# Patient Record
Sex: Male | Born: 1990 | Race: White | Hispanic: No | Marital: Single | Smoking: Never smoker
Health system: Southern US, Community
[De-identification: ages and names within clinical notes are randomized; demographics above are authoritative.]

## PROBLEM LIST (undated history)

## (undated) ENCOUNTER — Emergency Department (HOSPITAL_BASED_OUTPATIENT_CLINIC_OR_DEPARTMENT_OTHER): Discharge status: Absent without leave | Payer: BLUE CROSS/BLUE SHIELD

## (undated) DIAGNOSIS — E119 Type 2 diabetes mellitus without complications: Secondary | ICD-10-CM

---

## 2014-11-24 ENCOUNTER — Encounter (HOSPITAL_COMMUNITY): Payer: Self-pay | Admitting: *Deleted

## 2014-11-24 ENCOUNTER — Emergency Department (HOSPITAL_COMMUNITY): Payer: BLUE CROSS/BLUE SHIELD

## 2014-11-24 ENCOUNTER — Emergency Department (HOSPITAL_COMMUNITY)
Admission: EM | Admit: 2014-11-24 | Discharge: 2014-11-24 | Disposition: A | Discharge status: Home / Self Care | Payer: BLUE CROSS/BLUE SHIELD | Attending: Emergency Medicine | Admitting: Emergency Medicine

## 2014-11-24 DIAGNOSIS — Z794 Long term (current) use of insulin: Secondary | ICD-10-CM | POA: Diagnosis not present

## 2014-11-24 DIAGNOSIS — E119 Type 2 diabetes mellitus without complications: Secondary | ICD-10-CM | POA: Diagnosis not present

## 2014-11-24 DIAGNOSIS — S01312D Laceration without foreign body of left ear, subsequent encounter: Secondary | ICD-10-CM | POA: Diagnosis not present

## 2014-11-24 DIAGNOSIS — R220 Localized swelling, mass and lump, head: Secondary | ICD-10-CM

## 2014-11-24 DIAGNOSIS — L03211 Cellulitis of face: Secondary | ICD-10-CM | POA: Diagnosis not present

## 2014-11-24 DIAGNOSIS — R51 Headache: Secondary | ICD-10-CM | POA: Diagnosis present

## 2014-11-24 HISTORY — DX: Type 2 diabetes mellitus without complications: E11.9

## 2014-11-24 LAB — BASIC METABOLIC PANEL
Anion gap: 7 (ref 5–15)
CALCIUM: 8.8 mg/dL (ref 8.4–10.5)
CO2: 28 mmol/L (ref 19–32)
Chloride: 102 mmol/L (ref 96–112)
Creatinine, Ser: 0.55 mg/dL (ref 0.50–1.35)
GFR calc Af Amer: >90 mL/min (ref >90)
GFR calc non Af Amer: >90 mL/min (ref >90)
GLUCOSE: 151 mg/dL — AB (ref 70–99)
Potassium: 3.5 mmol/L (ref 3.5–5.1)
Sodium: 137 mmol/L (ref 135–145)

## 2014-11-24 LAB — CBC WITH DIFFERENTIAL/PLATELET
Basophils Absolute: 0 10*3/uL (ref 0.0–0.1)
Basophils Relative: 0 % (ref 0–1)
EOS ABS: 0.1 10*3/uL (ref 0.0–0.7)
EOS PCT: 2 % (ref 0–5)
HCT: 44 % (ref 39.0–52.0)
Hemoglobin: 15 g/dL (ref 13.0–17.0)
Lymphocytes Relative: 43 % (ref 12–46)
Lymphs Abs: 3 10*3/uL (ref 0.7–4.0)
MCH: 28.9 pg (ref 26.0–34.0)
MCHC: 34.1 g/dL (ref 30.0–36.0)
MCV: 84.8 fL (ref 78.0–100.0)
Monocytes Absolute: 0.5 10*3/uL (ref 0.1–1.0)
Monocytes Relative: 8 % (ref 3–12)
Neutro Abs: 3.2 10*3/uL (ref 1.7–7.7)
Neutrophils Relative %: 47 % (ref 43–77)
Platelets: 207 10*3/uL (ref 150–400)
RBC: 5.19 MIL/uL (ref 4.22–5.81)
RDW: 12.5 % (ref 11.5–15.5)
WBC: 6.9 10*3/uL (ref 4.0–10.5)

## 2014-11-24 LAB — CBG MONITORING, ED: Glucose-Capillary: 143 mg/dL — ABNORMAL HIGH (ref 70–99)

## 2014-11-24 LAB — I-STAT CG4 LACTIC ACID, ED: LACTIC ACID, VENOUS: 1.44 mmol/L (ref 0.5–2.0)

## 2014-11-24 MED ORDER — MORPHINE SULFATE 4 MG/ML IJ SOLN
4.0000 mg | Freq: Once | INTRAMUSCULAR | Status: AC
  Administered 2014-11-24: 4 mg via INTRAVENOUS
  Filled 2014-11-24: qty 1

## 2014-11-24 MED ORDER — CLINDAMYCIN HCL 150 MG PO CAPS: 300.0000 mg | ORAL_CAPSULE | Freq: Four times a day (QID) | ORAL | Status: AC

## 2014-11-24 MED ORDER — IOHEXOL 300 MG/ML  SOLN
75.0000 mL | Freq: Once | INTRAMUSCULAR | Status: AC | PRN
  Administered 2014-11-24: 75 mL via INTRAVENOUS

## 2014-11-24 MED ORDER — OXYCODONE-ACETAMINOPHEN 5-325 MG PO TABS: 2.0000 | ORAL_TABLET | ORAL | Status: DC | PRN

## 2014-11-24 MED ORDER — CLINDAMYCIN PHOSPHATE 600 MG/50ML IV SOLN
600.0000 mg | Freq: Once | INTRAVENOUS | Status: AC
  Administered 2014-11-24: 600 mg via INTRAVENOUS
  Filled 2014-11-24: qty 50

## 2014-11-24 MED ORDER — NAPROXEN 500 MG PO TABS: 500.0000 mg | ORAL_TABLET | Freq: Two times a day (BID) | ORAL | Status: DC

## 2014-11-24 MED ORDER — ONDANSETRON HCL 4 MG/2ML IJ SOLN
4.0000 mg | Freq: Once | INTRAMUSCULAR | Status: AC
  Administered 2014-11-24: 4 mg via INTRAVENOUS
  Filled 2014-11-24: qty 2

## 2014-11-24 NOTE — ED Notes (Signed)
Pt's CBG is 143.
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
## 2014-11-24 NOTE — Discharge Instructions (Signed)
Stop taking ciprofloxacin.  Begin taking clindamycin.  Take pain medicines as needed.  Follow-up with your primary care doctor in 2 days for recheck.  Return to the emergency department for worsening condition or new concerning symptoms.   Cellulitis Cellulitis is an infection of the skin and the tissue beneath it. The infected area is usually red and tender. Cellulitis occurs most often in the arms and lower legs.  CAUSES  Cellulitis is caused by bacteria that enter the skin through cracks or cuts in the skin. The most common types of bacteria that cause cellulitis are staphylococci and streptococci. SIGNS AND SYMPTOMS   Redness and warmth.  Swelling.  Tenderness or pain.  Fever. DIAGNOSIS  Your health care provider can usually determine what is wrong based on a physical exam. Blood tests may also be done. TREATMENT  Treatment usually involves taking an antibiotic medicine. HOME CARE INSTRUCTIONS   Take your antibiotic medicine as directed by your health care provider. Finish the antibiotic even if you start to feel better.  Keep the infected arm or leg elevated to reduce swelling.  Apply a warm cloth to the affected area up to 4 times per day to relieve pain.  Take medicines only as directed by your health care provider.  Keep all follow-up visits as directed by your health care provider. SEEK MEDICAL CARE IF:   You notice red streaks coming from the infected area.  Your red area gets larger or turns dark in color.  Your bone or joint underneath the infected area becomes painful after the skin has healed.  Your infection returns in the same area or another area.  You notice a swollen bump in the infected area.  You develop new symptoms.  You have a fever. SEEK IMMEDIATE MEDICAL CARE IF:   You feel very sleepy.  You develop vomiting or diarrhea.  You have a general ill feeling (malaise) with muscle aches and pains. MAKE SURE YOU:   Understand these  instructions.  Will watch your condition.  Will get help right away if you are not doing well or get worse. Document Released: 06/20/2005 Document Revised: 01/25/2014 Document Reviewed: 11/26/2011 [facility] Patient Information 2015 [place]. This information is not intended to replace advice given to you by your health care provider. Make sure you discuss any questions you have with your health care provider.  Facial Infection You have an infection of your face. This requires special attention to help prevent serious problems. Infections in facial wounds can cause poor healing and scars. They can also spread to deeper tissues, especially around the eye. Wound and dental infections can lead to sinusitis, infection of the eye socket, and even meningitis. Permanent damage to the skin, eye, and nervous system may result if facial infections are not treated properly. With severe infections, hospital care for IV antibiotic injections may be needed if they don't respond to oral antibiotics. Antibiotics must be taken for the full course to insure the infection is eliminated. If the infection came from a bad tooth, it may have to be extracted when the infection is under control. Warm compresses may be applied to reduce skin irritation and remove drainage. You might need a tetanus shot now if:  You cannot remember when your last tetanus shot was.  You have never had a tetanus shot.  The object that caused your wound was dirty. If you need a tetanus shot, and you decide not to get one, there is a rare chance of getting tetanus.  Sickness from tetanus can be serious. If you got a tetanus shot, your arm may swell, get red and warm to the touch at the shot site. This is common and not a problem. SEEK IMMEDIATE MEDICAL CARE IF:   You have increased swelling, redness, or trouble breathing.  You have a severe headache, dizziness, nausea, or vomiting.  You develop problems with your eyesight.  You have  a fever. Document Released: 10/18/2004 Document Revised: 12/03/2011 Document Reviewed: 09/10/2005 [facility] Patient Information 2015 [place], [place]. This information is not intended to replace advice given to you by your health care provider. Make sure you discuss any questions you have with your health care provider.
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
## 2016-07-01 ENCOUNTER — Emergency Department (HOSPITAL_COMMUNITY): Payer: BLUE CROSS/BLUE SHIELD

## 2016-07-01 ENCOUNTER — Encounter (HOSPITAL_COMMUNITY): Payer: Self-pay | Admitting: *Deleted

## 2016-07-01 ENCOUNTER — Inpatient Hospital Stay (HOSPITAL_COMMUNITY)
Admission: EM | Admit: 2016-07-01 | Discharge: 2016-07-04 | DRG: 481 | Disposition: A | Discharge status: Home / Self Care | Payer: BLUE CROSS/BLUE SHIELD | Attending: General Surgery | Admitting: General Surgery

## 2016-07-01 ENCOUNTER — Encounter (HOSPITAL_COMMUNITY): Admission: EM | Disposition: A | Discharge status: Home / Self Care | Payer: Self-pay | Source: Home / Self Care

## 2016-07-01 DIAGNOSIS — S72302C Unspecified fracture of shaft of left femur, initial encounter for open fracture type IIIA, IIIB, or IIIC: Secondary | ICD-10-CM | POA: Diagnosis present

## 2016-07-01 DIAGNOSIS — Z794 Long term (current) use of insulin: Secondary | ICD-10-CM | POA: Diagnosis not present

## 2016-07-01 DIAGNOSIS — Z419 Encounter for procedure for purposes other than remedying health state, unspecified: Secondary | ICD-10-CM

## 2016-07-01 DIAGNOSIS — S72352C Displaced comminuted fracture of shaft of left femur, initial encounter for open fracture type IIIA, IIIB, or IIIC: Secondary | ICD-10-CM | POA: Diagnosis not present

## 2016-07-01 DIAGNOSIS — M79652 Pain in left thigh: Secondary | ICD-10-CM | POA: Diagnosis present

## 2016-07-01 DIAGNOSIS — G478 Other sleep disorders: Secondary | ICD-10-CM

## 2016-07-01 DIAGNOSIS — S71132A Puncture wound without foreign body, left thigh, initial encounter: Secondary | ICD-10-CM

## 2016-07-01 DIAGNOSIS — S7292XB Unspecified fracture of left femur, initial encounter for open fracture type I or II: Secondary | ICD-10-CM | POA: Diagnosis present

## 2016-07-01 DIAGNOSIS — Z720 Tobacco use: Secondary | ICD-10-CM | POA: Diagnosis not present

## 2016-07-01 DIAGNOSIS — D62 Acute posthemorrhagic anemia: Secondary | ICD-10-CM | POA: Diagnosis not present

## 2016-07-01 DIAGNOSIS — E119 Type 2 diabetes mellitus without complications: Secondary | ICD-10-CM

## 2016-07-01 DIAGNOSIS — W3400XA Accidental discharge from unspecified firearms or gun, initial encounter: Secondary | ICD-10-CM

## 2016-07-01 DIAGNOSIS — W320XXA Accidental handgun discharge, initial encounter: Secondary | ICD-10-CM | POA: Diagnosis not present

## 2016-07-01 DIAGNOSIS — E109 Type 1 diabetes mellitus without complications: Secondary | ICD-10-CM | POA: Diagnosis present

## 2016-07-01 HISTORY — PX: INTRAMEDULLARY (IM) NAIL INTERTROCHANTERIC: SHX5875

## 2016-07-01 LAB — BASIC METABOLIC PANEL
ANION GAP: 15 (ref 5–15)
BUN: 9 mg/dL (ref 6–20)
CALCIUM: 9.3 mg/dL (ref 8.9–10.3)
CO2: 22 mmol/L (ref 22–32)
Chloride: 98 mmol/L — ABNORMAL LOW (ref 101–111)
Creatinine, Ser: 1.06 mg/dL (ref 0.61–1.24)
GFR calc Af Amer: >60 mL/min (ref >60)
GFR calc non Af Amer: >60 mL/min (ref >60)
GLUCOSE: 442 mg/dL — AB (ref 65–99)
POTASSIUM: 3.4 mmol/L — AB (ref 3.5–5.1)
Sodium: 135 mmol/L (ref 135–145)

## 2016-07-01 LAB — I-STAT CHEM 8, ED
BUN: 10 mg/dL (ref 6–20)
CHLORIDE: 98 mmol/L — AB (ref 101–111)
CREATININE: 1.1 mg/dL (ref 0.61–1.24)
Calcium, Ion: 1.1 mmol/L — ABNORMAL LOW (ref 1.15–1.40)
Glucose, Bld: 432 mg/dL — ABNORMAL HIGH (ref 65–99)
HEMATOCRIT: 47 % (ref 39.0–52.0)
Hemoglobin: 16 g/dL (ref 13.0–17.0)
POTASSIUM: 3.4 mmol/L — AB (ref 3.5–5.1)
Sodium: 139 mmol/L (ref 135–145)
TCO2: 23 mmol/L (ref 0–100)

## 2016-07-01 LAB — I-STAT VENOUS BLOOD GAS, ED
ACID-BASE DEFICIT: 2 mmol/L (ref 0.0–2.0)
BICARBONATE: 23.2 mmol/L (ref 20.0–28.0)
O2 SAT: 91 %
TCO2: 24 mmol/L (ref 0–100)
pCO2, Ven: 39.9 mmHg — ABNORMAL LOW (ref 44.0–60.0)
pH, Ven: 7.372 (ref 7.250–7.430)
pO2, Ven: 63 mmHg — ABNORMAL HIGH (ref 32.0–45.0)

## 2016-07-01 LAB — CBC
HEMATOCRIT: 45.2 % (ref 39.0–52.0)
HEMOGLOBIN: 15.9 g/dL (ref 13.0–17.0)
MCH: 29.6 pg (ref 26.0–34.0)
MCHC: 35.2 g/dL (ref 30.0–36.0)
MCV: 84 fL (ref 78.0–100.0)
Platelets: 250 10*3/uL (ref 150–400)
RBC: 5.38 MIL/uL (ref 4.22–5.81)
RDW: 12.3 % (ref 11.5–15.5)
WBC: 7.9 10*3/uL (ref 4.0–10.5)

## 2016-07-01 LAB — APTT: aPTT: 26 seconds (ref 24–36)

## 2016-07-01 LAB — PROTIME-INR
INR: 0.99
Prothrombin Time: 13.1 seconds (ref 11.4–15.2)

## 2016-07-01 LAB — CBG MONITORING, ED: GLUCOSE-CAPILLARY: 325 mg/dL — AB (ref 65–99)

## 2016-07-01 LAB — I-STAT CG4 LACTIC ACID, ED: Lactic Acid, Venous: 4.11 mmol/L (ref 0.5–1.9)

## 2016-07-01 LAB — I-STAT TROPONIN, ED: Troponin i, poc: 0 ng/mL (ref 0.00–0.08)

## 2016-07-01 SURGERY — FIXATION, FRACTURE, INTERTROCHANTERIC, WITH INTRAMEDULLARY ROD
Anesthesia: General | Site: Leg Upper | Laterality: Left

## 2016-07-01 MED ORDER — CEFAZOLIN SODIUM-DEXTROSE 2-4 GM/100ML-% IV SOLN
2.0000 g | Freq: Once | INTRAVENOUS | Status: AC
  Administered 2016-07-01: 2 g via INTRAVENOUS
  Filled 2016-07-01: qty 100

## 2016-07-01 MED ORDER — POTASSIUM CHLORIDE IN NACL 20-0.9 MEQ/L-% IV SOLN
INTRAVENOUS | Status: DC
  Administered 2016-07-02 – 2016-07-03 (×4): via INTRAVENOUS
  Filled 2016-07-01 (×4): qty 1000

## 2016-07-01 MED ORDER — INSULIN ASPART 100 UNIT/ML ~~LOC~~ SOLN
15.0000 [IU] | Freq: Once | SUBCUTANEOUS | Status: AC
  Administered 2016-07-01: 15 [IU] via INTRAVENOUS

## 2016-07-01 MED ORDER — HYDROMORPHONE HCL 1 MG/ML IJ SOLN
1.0000 mg | INTRAMUSCULAR | Status: DC | PRN
  Filled 2016-07-01: qty 1

## 2016-07-01 MED ORDER — FENTANYL CITRATE (PF) 100 MCG/2ML IJ SOLN
INTRAMUSCULAR | Status: AC
  Filled 2016-07-01: qty 4

## 2016-07-01 MED ORDER — SODIUM CHLORIDE 0.9 % IV SOLN
Freq: Once | INTRAVENOUS | Status: AC
  Administered 2016-07-01: via INTRAVENOUS

## 2016-07-01 MED ORDER — ONDANSETRON HCL 4 MG PO TABS: 4.0000 mg | ORAL_TABLET | Freq: Four times a day (QID) | ORAL | Status: DC | PRN

## 2016-07-01 MED ORDER — HYDROMORPHONE HCL 1 MG/ML IJ SOLN
1.0000 mg | Freq: Once | INTRAMUSCULAR | Status: AC
Start: 2016-07-01 — End: 2016-07-01
  Administered 2016-07-01: 1 mg via INTRAVENOUS

## 2016-07-01 MED ORDER — ENOXAPARIN SODIUM 40 MG/0.4ML ~~LOC~~ SOLN
40.0000 mg | SUBCUTANEOUS | Status: DC
  Administered 2016-07-02 – 2016-07-03 (×2): 40 mg via SUBCUTANEOUS
  Filled 2016-07-01 (×2): qty 0.4

## 2016-07-01 MED ORDER — INSULIN ASPART 100 UNIT/ML ~~LOC~~ SOLN
0.0000 [IU] | Freq: Three times a day (TID) | SUBCUTANEOUS | Status: DC
Start: 2016-07-02 — End: 2016-07-02
  Administered 2016-07-02: 7 [IU] via SUBCUTANEOUS
  Administered 2016-07-02: 4 [IU] via SUBCUTANEOUS
  Filled 2016-07-01: qty 1

## 2016-07-01 MED ORDER — TETANUS-DIPHTH-ACELL PERTUSSIS 5-2.5-18.5 LF-MCG/0.5 IM SUSP
0.5000 mL | Freq: Once | INTRAMUSCULAR | Status: AC
  Administered 2016-07-01: 0.5 mL via INTRAMUSCULAR
  Filled 2016-07-01: qty 0.5

## 2016-07-01 MED ORDER — CEFAZOLIN IN D5W 1 GM/50ML IV SOLN
1.0000 g | Freq: Three times a day (TID) | INTRAVENOUS | Status: DC
  Filled 2016-07-01 (×2): qty 50

## 2016-07-01 MED ORDER — MIDAZOLAM HCL 2 MG/2ML IJ SOLN
INTRAMUSCULAR | Status: AC
  Filled 2016-07-01: qty 2

## 2016-07-01 MED ORDER — INSULIN DETEMIR 100 UNIT/ML ~~LOC~~ SOLN
10.0000 [IU] | Freq: Every day | SUBCUTANEOUS | Status: DC
  Filled 2016-07-01 (×2): qty 0.1

## 2016-07-01 MED ORDER — ONDANSETRON HCL 4 MG/2ML IJ SOLN: 4.0000 mg | Freq: Four times a day (QID) | INTRAMUSCULAR | Status: DC | PRN

## 2016-07-01 MED ORDER — SODIUM CHLORIDE 0.9 % IV SOLN
Freq: Once | INTRAVENOUS | Status: AC
  Administered 2016-07-01: 23:00:00 via INTRAVENOUS

## 2016-07-01 SURGICAL SUPPLY — 43 items
BIT DRILL LONG 4.0 (BIT) ×1 IMPLANT
BIT DRILL SHORT 4.0 (BIT) ×1 IMPLANT
BLADE SURG 15 STRL LF DISP TIS (BLADE) ×1 IMPLANT
BLADE SURG 15 STRL SS (BLADE) ×2
COVER PERINEAL POST (MISCELLANEOUS) ×3 IMPLANT
COVER SURGICAL LIGHT HANDLE (MISCELLANEOUS) ×6 IMPLANT
COVER TABLE BACK 60X90 (DRAPES) ×3 IMPLANT
DRAPE C-ARM 42X72 X-RAY (DRAPES) ×3 IMPLANT
DRAPE STERI IOBAN 125X83 (DRAPES) ×3 IMPLANT
DRILL BIT LONG 4.0 (BIT) ×3
DRILL BIT SHORT 4.0 (BIT) ×2
DRSG ADAPTIC 3X8 NADH LF (GAUZE/BANDAGES/DRESSINGS) IMPLANT
DRSG MEPILEX BORDER 4X4 (GAUZE/BANDAGES/DRESSINGS) ×3 IMPLANT
DRSG MEPILEX BORDER 4X8 (GAUZE/BANDAGES/DRESSINGS) ×3 IMPLANT
DRSG PAD ABDOMINAL 8X10 ST (GAUZE/BANDAGES/DRESSINGS) ×6 IMPLANT
ELECT REM PT RETURN 9FT ADLT (ELECTROSURGICAL) ×3
ELECTRODE REM PT RTRN 9FT ADLT (ELECTROSURGICAL) ×1 IMPLANT
EVACUATOR 1/8 PVC DRAIN (DRAIN) IMPLANT
GLOVE BIOGEL PI IND STRL 9 (GLOVE) ×1 IMPLANT
GLOVE BIOGEL PI INDICATOR 9 (GLOVE) ×2
GLOVE SURG ORTHO 9.0 STRL STRW (GLOVE) ×3 IMPLANT
GOWN STRL REUS W/ TWL XL LVL3 (GOWN DISPOSABLE) ×3 IMPLANT
GOWN STRL REUS W/TWL XL LVL3 (GOWN DISPOSABLE) ×6
GUIDE PIN 3.2X343 (PIN) ×1
GUIDE PIN 3.2X343MM (PIN) ×2
GUIDE ROD 3.0 (MISCELLANEOUS) ×3
KIT BASIN OR (CUSTOM PROCEDURE TRAY) ×3 IMPLANT
KIT ROOM TURNOVER OR (KITS) ×3 IMPLANT
LINER BOOT UNIVERSAL DISP (MISCELLANEOUS) ×3 IMPLANT
MANIFOLD NEPTUNE II (INSTRUMENTS) ×3 IMPLANT
NAIL TRIGEN TROCH META 9.0X40 (Nail) ×3 IMPLANT
NS IRRIG 1000ML POUR BTL (IV SOLUTION) ×3 IMPLANT
PACK GENERAL/GYN (CUSTOM PROCEDURE TRAY) ×3 IMPLANT
PAD ARMBOARD 7.5X6 YLW CONV (MISCELLANEOUS) ×6 IMPLANT
PIN GUIDE 3.2X343MM (PIN) ×1 IMPLANT
ROD GUIDE 3.0 (MISCELLANEOUS) ×1 IMPLANT
SCREW TRIGEN LOW PROF 5.0X45 (Screw) ×3 IMPLANT
SCREW TRIGEN LOW PROF 5.0X50 (Screw) ×3 IMPLANT
SCREW TRIGEN LOW PROF 5.0X60 (Screw) ×3 IMPLANT
STAPLER VISISTAT 35W (STAPLE) IMPLANT
SUT ETHILON 2 0 PSLX (SUTURE) ×3 IMPLANT
SUT VIC AB 2-0 CTB1 (SUTURE) IMPLANT
WATER STERILE IRR 1000ML POUR (IV SOLUTION) ×6 IMPLANT

## 2016-07-01 NOTE — Anesthesia Preprocedure Evaluation (Signed)
Anesthesia Evaluation  Patient identified by MRN, date of birth, ID band Patient awake    Reviewed: Allergy & Precautions, NPO status , Patient's Chart, lab work & pertinent test results  Airway Mallampati: I  TM Distance: >3 FB Neck ROM: Full    Dental   Pulmonary    Pulmonary exam normal        Cardiovascular Normal cardiovascular exam     Neuro/Psych    GI/Hepatic   Endo/Other  diabetes, Poorly Controlled, Type 1, Insulin Dependent  Renal/GU      Musculoskeletal   Abdominal   Peds  Hematology   Anesthesia Other Findings   Reproductive/Obstetrics                             Anesthesia Physical Anesthesia Plan  ASA: II and emergent  Anesthesia Plan: General   Post-op Pain Management:    Induction: Intravenous, Rapid sequence and Cricoid pressure planned  Airway Management Planned: Oral ETT  Additional Equipment:   Intra-op Plan:   Post-operative Plan: Extubation in OR  Informed Consent: I have reviewed the patients History and Physical, chart, labs and discussed the procedure including the risks, benefits and alternatives for the proposed anesthesia with the patient or authorized representative who has indicated his/her understanding and acceptance.     Plan Discussed with: CRNA and Surgeon  Anesthesia Plan Comments:         Anesthesia Quick Evaluation
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
## 2016-07-02 ENCOUNTER — Inpatient Hospital Stay (HOSPITAL_COMMUNITY): Payer: BLUE CROSS/BLUE SHIELD | Admitting: Anesthesiology

## 2016-07-02 LAB — TYPE AND SCREEN
ABO/RH(D): O POS
ANTIBODY SCREEN: NEGATIVE
UNIT DIVISION: 0
UNIT DIVISION: 0

## 2016-07-02 LAB — PREPARE FRESH FROZEN PLASMA
Unit division: 0
Unit division: 0

## 2016-07-02 LAB — GLUCOSE, CAPILLARY
GLUCOSE-CAPILLARY: 223 mg/dL — AB (ref 65–99)
GLUCOSE-CAPILLARY: 174 mg/dL — AB (ref 65–99)
Glucose-Capillary: 217 mg/dL — ABNORMAL HIGH (ref 65–99)
Glucose-Capillary: 192 mg/dL — ABNORMAL HIGH (ref 65–99)
Glucose-Capillary: 158 mg/dL — ABNORMAL HIGH (ref 65–99)

## 2016-07-02 LAB — MRSA PCR SCREENING: MRSA by PCR: NEGATIVE

## 2016-07-02 LAB — BASIC METABOLIC PANEL
Anion gap: 9 (ref 5–15)
BUN: 7 mg/dL (ref 6–20)
CALCIUM: 7.7 mg/dL — AB (ref 8.9–10.3)
CO2: 23 mmol/L (ref 22–32)
Chloride: 106 mmol/L (ref 101–111)
Creatinine, Ser: 0.63 mg/dL (ref 0.61–1.24)
GFR calc Af Amer: >60 mL/min (ref >60)
GLUCOSE: 264 mg/dL — AB (ref 65–99)
Potassium: 4.3 mmol/L (ref 3.5–5.1)
Sodium: 138 mmol/L (ref 135–145)

## 2016-07-02 LAB — CBC
HCT: 35.1 % — ABNORMAL LOW (ref 39.0–52.0)
Hemoglobin: 11.9 g/dL — ABNORMAL LOW (ref 13.0–17.0)
MCH: 29 pg (ref 26.0–34.0)
MCHC: 33.9 g/dL (ref 30.0–36.0)
MCV: 85.6 fL (ref 78.0–100.0)
Platelets: 209 10*3/uL (ref 150–400)
RBC: 4.1 MIL/uL — ABNORMAL LOW (ref 4.22–5.81)
RDW: 12.5 % (ref 11.5–15.5)
WBC: 10 10*3/uL (ref 4.0–10.5)

## 2016-07-02 LAB — ABO/RH: ABO/RH(D): O POS

## 2016-07-02 LAB — BLOOD PRODUCT ORDER (VERBAL) VERIFICATION

## 2016-07-02 MED ORDER — ONDANSETRON HCL 4 MG PO TABS: 4.0000 mg | ORAL_TABLET | Freq: Four times a day (QID) | ORAL | Status: DC | PRN

## 2016-07-02 MED ORDER — PHENYLEPHRINE HCL 10 MG/ML IJ SOLN
INTRAVENOUS | Status: DC | PRN
  Administered 2016-07-02: 10 ug/min via INTRAVENOUS

## 2016-07-02 MED ORDER — METOCLOPRAMIDE HCL 5 MG PO TABS: 5.0000 mg | ORAL_TABLET | Freq: Three times a day (TID) | ORAL | Status: DC | PRN

## 2016-07-02 MED ORDER — ONDANSETRON HCL 4 MG/2ML IJ SOLN: 4.0000 mg | Freq: Once | INTRAMUSCULAR | Status: DC | PRN

## 2016-07-02 MED ORDER — OXYCODONE HCL 5 MG PO TABS
5.0000 mg | ORAL_TABLET | ORAL | Status: DC | PRN
  Administered 2016-07-02 (×2): 10 mg via ORAL
  Administered 2016-07-03: 5 mg via ORAL
  Filled 2016-07-02 (×3): qty 2

## 2016-07-02 MED ORDER — HYDROMORPHONE HCL 1 MG/ML IJ SOLN
0.2500 mg | INTRAMUSCULAR | Status: DC | PRN
  Administered 2016-07-02 (×4): 0.5 mg via INTRAVENOUS

## 2016-07-02 MED ORDER — HYDROMORPHONE HCL 1 MG/ML IJ SOLN
INTRAMUSCULAR | Status: AC
  Filled 2016-07-02: qty 1

## 2016-07-02 MED ORDER — FENTANYL CITRATE (PF) 250 MCG/5ML IJ SOLN
INTRAMUSCULAR | Status: DC | PRN
  Administered 2016-07-02 (×2): 50 ug via INTRAVENOUS
  Administered 2016-07-02: 100 ug via INTRAVENOUS

## 2016-07-02 MED ORDER — MIDAZOLAM HCL 5 MG/5ML IJ SOLN
INTRAMUSCULAR | Status: DC | PRN
  Administered 2016-07-02 (×2): 1 mg via INTRAVENOUS

## 2016-07-02 MED ORDER — INSULIN ASPART 100 UNIT/ML ~~LOC~~ SOLN: 0.0000 [IU] | Freq: Every day | SUBCUTANEOUS | Status: DC

## 2016-07-02 MED ORDER — ACETAMINOPHEN 325 MG PO TABS
650.0000 mg | ORAL_TABLET | Freq: Four times a day (QID) | ORAL | Status: DC | PRN
  Administered 2016-07-02 – 2016-07-03 (×2): 650 mg via ORAL
  Filled 2016-07-02 (×2): qty 2

## 2016-07-02 MED ORDER — INSULIN ASPART 100 UNIT/ML ~~LOC~~ SOLN
0.0000 [IU] | Freq: Three times a day (TID) | SUBCUTANEOUS | Status: DC
  Administered 2016-07-02 – 2016-07-04 (×4): 3 [IU] via SUBCUTANEOUS

## 2016-07-02 MED ORDER — EPHEDRINE SULFATE 50 MG/ML IJ SOLN
INTRAMUSCULAR | Status: DC | PRN
  Administered 2016-07-02 (×2): 5 mg via INTRAVENOUS

## 2016-07-02 MED ORDER — PROPOFOL 10 MG/ML IV BOLUS
INTRAVENOUS | Status: DC | PRN
  Administered 2016-07-02: 140 mg via INTRAVENOUS

## 2016-07-02 MED ORDER — CEFAZOLIN IN D5W 1 GM/50ML IV SOLN
1.0000 g | Freq: Four times a day (QID) | INTRAVENOUS | Status: AC
  Administered 2016-07-02 (×3): 1 g via INTRAVENOUS
  Filled 2016-07-02 (×4): qty 50

## 2016-07-02 MED ORDER — SODIUM CHLORIDE 0.9 % IV SOLN
INTRAVENOUS | Status: DC | PRN
  Administered 2016-07-02 (×2): via INTRAVENOUS

## 2016-07-02 MED ORDER — ACETAMINOPHEN 650 MG RE SUPP: 650.0000 mg | Freq: Four times a day (QID) | RECTAL | Status: DC | PRN

## 2016-07-02 MED ORDER — SUCCINYLCHOLINE CHLORIDE 20 MG/ML IJ SOLN
INTRAMUSCULAR | Status: DC | PRN
  Administered 2016-07-02: 100 mg via INTRAVENOUS

## 2016-07-02 MED ORDER — LIDOCAINE HCL (CARDIAC) 20 MG/ML IV SOLN
INTRAVENOUS | Status: DC | PRN
  Administered 2016-07-02: 100 mg via INTRATRACHEAL

## 2016-07-02 MED ORDER — METHOCARBAMOL 1000 MG/10ML IJ SOLN: 500.0000 mg | Freq: Four times a day (QID) | INTRAVENOUS | Status: DC | PRN

## 2016-07-02 MED ORDER — HYDROMORPHONE HCL 1 MG/ML IJ SOLN
1.0000 mg | INTRAMUSCULAR | Status: DC | PRN
Start: 2016-07-02 — End: 2016-07-03
  Administered 2016-07-02 – 2016-07-03 (×6): 1 mg via INTRAVENOUS
  Filled 2016-07-02 (×6): qty 1

## 2016-07-02 MED ORDER — 0.9 % SODIUM CHLORIDE (POUR BTL) OPTIME
TOPICAL | Status: DC | PRN
  Administered 2016-07-02: 1000 mL

## 2016-07-02 MED ORDER — ONDANSETRON HCL 4 MG/2ML IJ SOLN: 4.0000 mg | Freq: Four times a day (QID) | INTRAMUSCULAR | Status: DC | PRN

## 2016-07-02 MED ORDER — METOCLOPRAMIDE HCL 5 MG/ML IJ SOLN: 5.0000 mg | Freq: Three times a day (TID) | INTRAMUSCULAR | Status: DC | PRN

## 2016-07-02 MED ORDER — METHOCARBAMOL 500 MG PO TABS
500.0000 mg | ORAL_TABLET | Freq: Four times a day (QID) | ORAL | Status: DC | PRN
  Administered 2016-07-02 (×3): 500 mg via ORAL
  Filled 2016-07-02 (×3): qty 1

## 2016-07-02 MED ORDER — CEFAZOLIN IN D5W 1 GM/50ML IV SOLN
INTRAVENOUS | Status: DC | PRN
  Administered 2016-07-02: 1 g via INTRAVENOUS

## 2016-07-02 MED ORDER — ALBUMIN HUMAN 5 % IV SOLN
INTRAVENOUS | Status: DC | PRN
  Administered 2016-07-02: 01:00:00 via INTRAVENOUS

## 2016-07-02 MED ORDER — INSULIN ASPART 100 UNIT/ML ~~LOC~~ SOLN
4.0000 [IU] | Freq: Three times a day (TID) | SUBCUTANEOUS | Status: DC
  Administered 2016-07-02 – 2016-07-04 (×2): 4 [IU] via SUBCUTANEOUS

## 2016-07-02 MED ORDER — MEPERIDINE HCL 25 MG/ML IJ SOLN: 6.2500 mg | INTRAMUSCULAR | Status: DC | PRN

## 2016-07-02 MED ORDER — CEFAZOLIN IN D5W 1 GM/50ML IV SOLN
INTRAVENOUS | Status: AC
  Administered 2016-07-02: 1 g via INTRAVENOUS
  Filled 2016-07-02: qty 50

## 2016-07-02 MED ORDER — METHOCARBAMOL 500 MG PO TABS
ORAL_TABLET | ORAL | Status: AC
  Filled 2016-07-02: qty 1

## 2016-07-02 MED ORDER — ONDANSETRON HCL 4 MG/2ML IJ SOLN
INTRAMUSCULAR | Status: DC | PRN
  Administered 2016-07-02: 4 mg via INTRAVENOUS

## 2016-07-02 MED ORDER — INSULIN DETEMIR 100 UNIT/ML ~~LOC~~ SOLN
10.0000 [IU] | Freq: Two times a day (BID) | SUBCUTANEOUS | Status: DC
  Administered 2016-07-02 – 2016-07-04 (×4): 10 [IU] via SUBCUTANEOUS
  Filled 2016-07-02 (×6): qty 0.1

## 2016-07-02 MED ORDER — HYDROMORPHONE HCL 1 MG/ML IJ SOLN
INTRAMUSCULAR | Status: AC
  Administered 2016-07-02: 1 mg
  Filled 2016-07-02: qty 1

## 2016-07-02 MED ORDER — CEFAZOLIN SODIUM 1 G IJ SOLR
INTRAMUSCULAR | Status: AC
  Filled 2016-07-02: qty 10

## 2016-07-02 MED ORDER — PHENYLEPHRINE HCL 10 MG/ML IJ SOLN
INTRAMUSCULAR | Status: DC | PRN
  Administered 2016-07-02 (×2): 80 ug via INTRAVENOUS

## 2016-07-02 NOTE — Progress Notes (Signed)
PT attempted to mobilize patient but due to bleeding from surgical site they were unable to continue. RN reinforced dressing. Patient put back to bed.
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
## 2016-07-02 NOTE — Transfer of Care (Signed)
Immediate Anesthesia Transfer of Care Note  Patient: Jesse Griffin  Procedure(s) Performed: Procedure(s): INTRAMEDULLARY (IM) NAIL INTERTROCHANTRIC (Left)  Patient Location: PACU  Anesthesia Type:General  Level of Consciousness: sedated  Airway & Oxygen Therapy: Patient Spontanous Breathing and Patient connected to face mask oxygen  Post-op Assessment: Report given to RN and Post -op Vital signs reviewed and stable  Post vital signs: Reviewed and stable  Last Vitals:  Vitals:   07/01/16 2315 07/01/16 2330  BP: 136/99 144/96  Pulse: 118 (!) 121  Resp: 14 13  Temp:      Last Pain:  Vitals:   07/01/16 2336  TempSrc:   PainSc: 5          Complications: No apparent anesthesia complications

## 2016-07-02 NOTE — Anesthesia Postprocedure Evaluation (Signed)
Anesthesia Post Note  Patient: Teacher, [name]  Procedure(s) Performed: Procedure(s) (LRB): INTRAMEDULLARY (IM) NAIL INTERTROCHANTRIC (Left)  Patient location during evaluation: PACU Anesthesia Type: General Level of consciousness: awake and alert Pain management: pain level controlled Vital Signs Assessment: post-procedure vital signs reviewed and stable Respiratory status: spontaneous breathing, nonlabored ventilation, respiratory function stable and patient connected to nasal cannula oxygen Cardiovascular status: blood pressure returned to baseline and stable Postop Assessment: no signs of nausea or vomiting Anesthetic complications: no    Last Vitals:  Vitals:   07/02/16 1200 07/02/16 1300  BP: 121/85   Pulse: (!) 111 (!) 112  Resp: 14 15  Temp: 37.1 C     Last Pain:  Vitals:   07/02/16 1251  TempSrc:   PainSc: 1                  [name] DAVID
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
## 2016-07-02 NOTE — Progress Notes (Addendum)
Inpatient Diabetes Program Recommendations  AACE/ADA: New Consensus Statement on Inpatient Glycemic Control (2015)  Target Ranges:  Prepandial:   less than 140 mg/dL      Peak postprandial:   less than 180 mg/dL (1-2 hours)      Critically ill patients:  140 - 180 mg/dL   Lab Results  Component Value Date   GLUCAP 192 (H) 07/02/2016    Review of Glycemic Control:  Results for Jesse Griffin, Jesse (MRN [id number]) as of 07/02/2016 13:16  Ref. Range 07/01/2016 22:52 07/01/2016 23:04 07/02/2016 08:23  Glucose Latest Ref Range: 65 - 99 mg/dL 829442 (H) 562432 (H) 130264 (H)   Diabetes history: Type 1 diabetes since age 25 y.o. Outpatient Diabetes medications: Regular insulin 15-20 units tid- He also has NPH but admits that he doesn't take it Current orders for Inpatient glycemic control:  Levemir 10 units daily, Novolog resistant tid with meals  Inpatient Diabetes Program Recommendations:    Spoke with patient and mother at length regarding diabetes history and home medication regimen.  Patient's mother states that [facility]  He admits that he does this due to not liking the last MD that he saw and not having refills available on Lantus/Novolog. He is currently able to get insulin through his [employer] insurance however he will be turning 25 in November and plans to get his own policy.  He does not count CHO or follow any specific regimen noting that he takes his insulin frequently based on how he feels. Discussed Lantus/Novolog regimen.  Also reinforced need for follow-up with endocrinologist and the importance of glycemic control.  Called and discussed with PA.  Orders received for Levemir 10 units bid, Novolog 4 units with meals and moderate correction.   Will follow-up on 10/10.    Thanks, Beryl MeagerJenny Vashaun Osmon, RN, BC-ADM Inpatient Diabetes Coordinator Pager [phone] (8a-5p)

## 2016-07-02 NOTE — Anesthesia Procedure Notes (Signed)
Procedure Name: Intubation Date/Time: 07/02/2016 12:45 AM Performed by: Brien MatesMAHONY, Meaghann Choo D Pre-anesthesia Checklist: Patient identified, Emergency Drugs available, Suction available, Patient being monitored and Timeout performed Patient Re-evaluated:Patient Re-evaluated prior to inductionOxygen Delivery Method: Circle system utilized Intubation Type: IV induction and Cricoid Pressure applied Ventilation: Mask ventilation without difficulty Laryngoscope Size: Miller and 2 Grade View: Grade I Tube type: Oral Tube size: 7.5 mm Number of attempts: 1 Airway Equipment and Method: Stylet Placement Confirmation: ETT inserted through vocal cords under direct vision,  positive ETCO2 and breath sounds checked- equal and bilateral Secured at: 22 cm Tube secured with: Tape Dental Injury: Teeth and Oropharynx as per pre-operative assessment

## 2016-07-02 NOTE — Anesthesia Preprocedure Evaluation (Addendum)
Anesthesia Evaluation  Patient identified by MRN, date of birth, ID band Patient awake    Reviewed: Allergy & Precautions, NPO status , Patient's Chart, lab work & pertinent test results  Airway Mallampati: I  TM Distance: <3 FB Neck ROM: Full    Dental  (+) Teeth Intact, Dental Advisory Given   Pulmonary    Pulmonary exam normal        Cardiovascular Normal cardiovascular exam     Neuro/Psych    GI/Hepatic   Endo/Other  diabetes, Poorly Controlled, Type 1, Insulin Dependent  Renal/GU      Musculoskeletal   Abdominal   Peds  Hematology   Anesthesia Other Findings   Reproductive/Obstetrics                            Anesthesia Physical Anesthesia Plan  ASA: III and emergent  Anesthesia Plan: General   Post-op Pain Management:    Induction: Intravenous, Rapid sequence and Cricoid pressure planned  Airway Management Planned: Oral ETT  Additional Equipment:   Intra-op Plan:   Post-operative Plan: Extubation in OR  Informed Consent: I have reviewed the patients History and Physical, chart, labs and discussed the procedure including the risks, benefits and alternatives for the proposed anesthesia with the patient or authorized representative who has indicated his/her understanding and acceptance.   Dental advisory given  Plan Discussed with: CRNA and Surgeon  Anesthesia Plan Comments:        Anesthesia Quick Evaluation

## 2016-07-02 NOTE — Progress Notes (Signed)
Patient ID: Jesse BurnsDylan Griffin, male   DOB: 12/03/1990, 25 y.o.   MRN: [id number] Postoperative day 1 intramedullary nailing open type IIIA comminuted left femur fracture. Patient's left lower extremity is neurovascular intact. We'll continue IV antibiotics. Physical therapy nonweightbearing on the left.
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
## 2016-07-03 DIAGNOSIS — D62 Acute posthemorrhagic anemia: Secondary | ICD-10-CM | POA: Diagnosis not present

## 2016-07-03 DIAGNOSIS — W3400XA Accidental discharge from unspecified firearms or gun, initial encounter: Secondary | ICD-10-CM

## 2016-07-03 DIAGNOSIS — E119 Type 2 diabetes mellitus without complications: Secondary | ICD-10-CM

## 2016-07-03 DIAGNOSIS — S71132A Puncture wound without foreign body, left thigh, initial encounter: Secondary | ICD-10-CM

## 2016-07-03 LAB — POCT I-STAT 4, (NA,K, GLUC, HGB,HCT)
GLUCOSE: 226 mg/dL — AB (ref 65–99)
Glucose, Bld: 247 mg/dL — ABNORMAL HIGH (ref 65–99)
HCT: 38 % — ABNORMAL LOW (ref 39.0–52.0)
HEMATOCRIT: 37 % — AB (ref 39.0–52.0)
HEMOGLOBIN: 12.6 g/dL — AB (ref 13.0–17.0)
HEMOGLOBIN: 12.9 g/dL — AB (ref 13.0–17.0)
POTASSIUM: 4.7 mmol/L (ref 3.5–5.1)
Potassium: 3.1 mmol/L — ABNORMAL LOW (ref 3.5–5.1)
SODIUM: 143 mmol/L (ref 135–145)
SODIUM: 144 mmol/L (ref 135–145)

## 2016-07-03 LAB — GLUCOSE, CAPILLARY
GLUCOSE-CAPILLARY: 102 mg/dL — AB (ref 65–99)
Glucose-Capillary: 171 mg/dL — ABNORMAL HIGH (ref 65–99)
Glucose-Capillary: 162 mg/dL — ABNORMAL HIGH (ref 65–99)
Glucose-Capillary: 131 mg/dL — ABNORMAL HIGH (ref 65–99)

## 2016-07-03 LAB — HEMOGLOBIN A1C
HEMOGLOBIN A1C: 10.6 % — AB (ref 4.8–5.6)
Mean Plasma Glucose: 258 mg/dL

## 2016-07-03 MED ORDER — OXYCODONE HCL 5 MG PO TABS
10.0000 mg | ORAL_TABLET | ORAL | Status: DC | PRN
  Administered 2016-07-03: 15 mg via ORAL
  Filled 2016-07-03: qty 3

## 2016-07-03 MED ORDER — HYDROMORPHONE HCL 1 MG/ML IJ SOLN: 0.5000 mg | INTRAMUSCULAR | Status: DC | PRN

## 2016-07-03 MED ORDER — DOCUSATE SODIUM 100 MG PO CAPS
100.0000 mg | ORAL_CAPSULE | Freq: Two times a day (BID) | ORAL | Status: DC
  Administered 2016-07-03 – 2016-07-04 (×3): 100 mg via ORAL
  Filled 2016-07-03 (×3): qty 1

## 2016-07-03 MED ORDER — POLYETHYLENE GLYCOL 3350 17 G PO PACK
17.0000 g | PACK | Freq: Every day | ORAL | Status: DC
  Administered 2016-07-03: 17 g via ORAL
  Filled 2016-07-03 (×2): qty 1

## 2016-07-03 MED ORDER — TRAMADOL HCL 50 MG PO TABS
100.0000 mg | ORAL_TABLET | Freq: Four times a day (QID) | ORAL | Status: DC
  Administered 2016-07-03 – 2016-07-04 (×6): 100 mg via ORAL
  Filled 2016-07-03 (×6): qty 2

## 2016-07-03 NOTE — Progress Notes (Signed)
Patient ID: Jesse Griffin, male   DOB: 01/01/1991, 25 y.o.   MRN: [id number] Patient more comfortable this morning. Left lower extremity neurovascular intact. We'll start dressing changes today. No progress with therapy yesterday. Anticipate discharge to home once patient is independent with ambulation.

## 2016-07-03 NOTE — Progress Notes (Signed)
Inpatient Diabetes Program Recommendations  AACE/ADA: New Consensus Statement on Inpatient Glycemic Control (2015)  Target Ranges:  Prepandial:   less than 140 mg/dL      Peak postprandial:   less than 180 mg/dL (1-2 hours)      Critically ill patients:  140 - 180 mg/dL   Results for Jesse Griffin, Jesse Griffin (MRN [id number]) as of 07/03/2016 08:01  Ref. Range 07/02/2016 08:42 07/02/2016 12:27 07/02/2016 15:50 07/02/2016 21:43 07/03/2016 06:09  Glucose-Capillary Latest Ref Range: 65 - 99 mg/dL 409217 (H)   Novolog 7 units 192 (H)  Novolog 4 units 158 (H)  Novolog 7 units 174 (H)  Levemir 10 units 171 (H)  Novolog 3 units   Review of Glycemic Control  Diabetes history: DM1 Outpatient Diabetes medications: Regular insulin 15-20 units tid- He also has NPH but admits that he doesn't take it Current orders for Inpatient glycemic control: Levemir 10 units BID, Novolog 0-15 units TID with meals, Novolog 0-5 units QHS, Novolog 4 units TID with meals  Inpatient Diabetes Program Recommendations: Insulin - Basal: Patient only recieved one dose of Levemir 10 units on 07/02/16 and fasting glucose is 171 mg/dl today. Patient also recieved a total of Novolog 18 units (14 for SSI and 4 for meal coverage) on  07/02/16. Do not recommend any changes with basal insulin at this time. Correction (SSI): Please decrease Novolog correction to senstive scale.  Thanks, Orlando PennerMarie Murriel Eidem, RN, MSN, CDE Diabetes Coordinator Inpatient Diabetes Program [phone] (Team Pager from 8am to 5pm) [phone] ([facility] office) [phone] [facility] office) [phone] [facility] office)
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
## 2016-07-03 NOTE — Plan of Care (Signed)
Problem: Pain Managment: Goal: General experience of comfort will improve Outcome: Progressing Medicated twice for severe pain with moderate relief

## 2016-07-03 NOTE — Clinical Social Work Note (Signed)
CSW went by for assessment and SBIRT. Patient asleep and did not arouse to conversation between CSW and his mother. CSW will try again later.  Jesse CourtSarah Antero Derosia, CSW [phone]

## 2016-07-04 ENCOUNTER — Encounter (HOSPITAL_COMMUNITY): Payer: Self-pay | Admitting: Orthopedic Surgery

## 2016-07-04 LAB — GLUCOSE, CAPILLARY
GLUCOSE-CAPILLARY: 132 mg/dL — AB (ref 65–99)
GLUCOSE-CAPILLARY: 169 mg/dL — AB (ref 65–99)
Glucose-Capillary: 127 mg/dL — ABNORMAL HIGH (ref 65–99)

## 2016-07-04 MED ORDER — TRAMADOL HCL 50 MG PO TABS: 100.0000 mg | ORAL_TABLET | Freq: Four times a day (QID) | ORAL | 0 refills | Status: DC | PRN

## 2016-07-04 MED ORDER — INSULIN DETEMIR 100 UNIT/ML ~~LOC~~ SOLN: 10.0000 [IU] | Freq: Two times a day (BID) | SUBCUTANEOUS | 0 refills | Status: AC

## 2016-07-04 MED ORDER — OXYCODONE-ACETAMINOPHEN 5-325 MG PO TABS: 1.0000 | ORAL_TABLET | ORAL | 0 refills | Status: DC | PRN
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
## 2016-07-04 NOTE — Discharge Instructions (Signed)
Do not put weight on your left leg.  No driving while taking oxycodone.

## 2016-07-04 NOTE — Progress Notes (Signed)
Per bedside nurse, pt unable to use crutches and needs RW.  Will obtain order for RW; [facility].    Quintella BatonJulie W. Eason Housman, RN, BSN  Trauma/Neuro ICU Case Manager [phone]
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
## 2016-07-04 NOTE — Progress Notes (Signed)
Reviewed discharge papers and medications with mom and son with full understanding, received a bedside commode and a front wheel walker with full understanding on how to use equipment, patient also stated that his bottle of insulin was broken in ED the evening he came in Pharmacy replaced it without a problem.

## 2016-07-05 ENCOUNTER — Encounter (HOSPITAL_COMMUNITY): Payer: Self-pay | Admitting: *Deleted
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
## 2016-07-18 ENCOUNTER — Inpatient Hospital Stay (INDEPENDENT_AMBULATORY_CARE_PROVIDER_SITE_OTHER): Payer: BLUE CROSS/BLUE SHIELD | Admitting: Family

## 2016-07-20 ENCOUNTER — Ambulatory Visit (INDEPENDENT_AMBULATORY_CARE_PROVIDER_SITE_OTHER): Payer: BLUE CROSS/BLUE SHIELD | Admitting: Family

## 2016-07-20 ENCOUNTER — Encounter (INDEPENDENT_AMBULATORY_CARE_PROVIDER_SITE_OTHER): Payer: Self-pay | Admitting: Family

## 2016-07-20 ENCOUNTER — Ambulatory Visit (INDEPENDENT_AMBULATORY_CARE_PROVIDER_SITE_OTHER): Payer: BLUE CROSS/BLUE SHIELD

## 2016-07-20 VITALS — Ht 70.0 in | Wt 150.0 lb

## 2016-07-20 DIAGNOSIS — S72352F Displaced comminuted fracture of shaft of left femur, subsequent encounter for open fracture type IIIA, IIIB, or IIIC with routine healing: Secondary | ICD-10-CM

## 2016-07-20 DIAGNOSIS — W3400XD Accidental discharge from unspecified firearms or gun, subsequent encounter: Secondary | ICD-10-CM

## 2016-07-20 DIAGNOSIS — S71132D Puncture wound without foreign body, left thigh, subsequent encounter: Principal | ICD-10-CM

## 2016-07-20 DIAGNOSIS — M79652 Pain in left thigh: Secondary | ICD-10-CM | POA: Diagnosis not present
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
## 2016-07-22 MED ORDER — DOXYCYCLINE HYCLATE 100 MG PO TABS: 100.0000 mg | ORAL_TABLET | Freq: Two times a day (BID) | ORAL | 0 refills | Status: AC

## 2016-07-23 ENCOUNTER — Telehealth (INDEPENDENT_AMBULATORY_CARE_PROVIDER_SITE_OTHER): Payer: Self-pay | Admitting: Family

## 2016-07-23 NOTE — Telephone Encounter (Signed)
Patient states that RX for an antibiotic was supposed to be sent over to the pharmacy. Pharmacy states that they have not received the RX.  [facility] [address], [place], [place]  Contact Info: [phone]

## 2016-07-23 NOTE — Telephone Encounter (Signed)
Medication was sent in EPIC, I called medication into pharmacy. I called patient left voicemail to make aware.

## 2016-08-03 ENCOUNTER — Encounter (INDEPENDENT_AMBULATORY_CARE_PROVIDER_SITE_OTHER): Payer: Self-pay | Admitting: Family

## 2016-08-03 ENCOUNTER — Ambulatory Visit (INDEPENDENT_AMBULATORY_CARE_PROVIDER_SITE_OTHER): Payer: BLUE CROSS/BLUE SHIELD

## 2016-08-03 ENCOUNTER — Ambulatory Visit (INDEPENDENT_AMBULATORY_CARE_PROVIDER_SITE_OTHER): Payer: BLUE CROSS/BLUE SHIELD | Admitting: Family

## 2016-08-03 VITALS — Ht 70.0 in | Wt 150.0 lb

## 2016-08-03 DIAGNOSIS — S72352F Displaced comminuted fracture of shaft of left femur, subsequent encounter for open fracture type IIIA, IIIB, or IIIC with routine healing: Secondary | ICD-10-CM

## 2016-08-03 DIAGNOSIS — S71132D Puncture wound without foreign body, left thigh, subsequent encounter: Principal | ICD-10-CM

## 2016-08-03 DIAGNOSIS — W3400XD Accidental discharge from unspecified firearms or gun, subsequent encounter: Secondary | ICD-10-CM
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
## 2016-08-20 ENCOUNTER — Ambulatory Visit (INDEPENDENT_AMBULATORY_CARE_PROVIDER_SITE_OTHER): Payer: BLUE CROSS/BLUE SHIELD | Admitting: Orthopedic Surgery

## 2016-08-27 ENCOUNTER — Ambulatory Visit (INDEPENDENT_AMBULATORY_CARE_PROVIDER_SITE_OTHER): Payer: BLUE CROSS/BLUE SHIELD | Admitting: Family

## 2016-08-27 ENCOUNTER — Ambulatory Visit (INDEPENDENT_AMBULATORY_CARE_PROVIDER_SITE_OTHER): Payer: Self-pay

## 2016-08-27 DIAGNOSIS — M5442 Lumbago with sciatica, left side: Secondary | ICD-10-CM

## 2016-08-27 DIAGNOSIS — W3400XD Accidental discharge from unspecified firearms or gun, subsequent encounter: Secondary | ICD-10-CM

## 2016-08-27 DIAGNOSIS — S72352F Displaced comminuted fracture of shaft of left femur, subsequent encounter for open fracture type IIIA, IIIB, or IIIC with routine healing: Secondary | ICD-10-CM

## 2016-08-27 DIAGNOSIS — S71132D Puncture wound without foreign body, left thigh, subsequent encounter: Principal | ICD-10-CM

## 2016-08-27 MED ORDER — NAPROXEN 500 MG PO TABS: 500.0000 mg | ORAL_TABLET | Freq: Two times a day (BID) | ORAL | 0 refills | Status: DC

## 2016-08-27 MED ORDER — PREDNISONE 10 MG PO TABS: ORAL_TABLET | ORAL | 0 refills | Status: DC

## 2016-08-29 MED ORDER — NAPROXEN 500 MG PO TABS: 500.0000 mg | ORAL_TABLET | Freq: Two times a day (BID) | ORAL | 0 refills | Status: AC
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
## 2016-09-25 ENCOUNTER — Ambulatory Visit (INDEPENDENT_AMBULATORY_CARE_PROVIDER_SITE_OTHER): Payer: BLUE CROSS/BLUE SHIELD | Admitting: Orthopedic Surgery

## 2017-03-11 DIAGNOSIS — E1065 Type 1 diabetes mellitus with hyperglycemia: Secondary | ICD-10-CM

## 2017-03-11 DIAGNOSIS — L0291 Cutaneous abscess, unspecified: Secondary | ICD-10-CM

## 2017-03-11 DIAGNOSIS — A419 Sepsis, unspecified organism: Secondary | ICD-10-CM

## 2017-03-11 DIAGNOSIS — L0390 Cellulitis, unspecified: Secondary | ICD-10-CM

## 2017-03-13 DIAGNOSIS — E109 Type 1 diabetes mellitus without complications: Secondary | ICD-10-CM

## 2017-03-13 DIAGNOSIS — E860 Dehydration: Secondary | ICD-10-CM

## 2017-03-13 DIAGNOSIS — E1310 Other specified diabetes mellitus with ketoacidosis without coma: Secondary | ICD-10-CM

## 2021-08-03 ENCOUNTER — Encounter (HOSPITAL_COMMUNITY): Payer: Self-pay | Admitting: Oncology

## 2021-08-03 ENCOUNTER — Emergency Department (HOSPITAL_COMMUNITY): Payer: Managed Care, Other (non HMO)

## 2021-08-03 ENCOUNTER — Other Ambulatory Visit: Payer: Self-pay

## 2021-08-03 ENCOUNTER — Emergency Department (HOSPITAL_COMMUNITY)
Admission: EM | Admit: 2021-08-03 | Discharge: 2021-08-03 | Disposition: A | Discharge status: Home / Self Care | Payer: Managed Care, Other (non HMO) | Attending: Emergency Medicine | Admitting: Emergency Medicine

## 2021-08-03 DIAGNOSIS — S92325A Nondisplaced fracture of second metatarsal bone, left foot, initial encounter for closed fracture: Secondary | ICD-10-CM | POA: Insufficient documentation

## 2021-08-03 DIAGNOSIS — E119 Type 2 diabetes mellitus without complications: Secondary | ICD-10-CM | POA: Diagnosis not present

## 2021-08-03 DIAGNOSIS — R079 Chest pain, unspecified: Secondary | ICD-10-CM | POA: Diagnosis not present

## 2021-08-03 DIAGNOSIS — Y92410 Unspecified street and highway as the place of occurrence of the external cause: Secondary | ICD-10-CM | POA: Diagnosis not present

## 2021-08-03 DIAGNOSIS — S0990XA Unspecified injury of head, initial encounter: Secondary | ICD-10-CM | POA: Insufficient documentation

## 2021-08-03 DIAGNOSIS — Z794 Long term (current) use of insulin: Secondary | ICD-10-CM | POA: Diagnosis not present

## 2021-08-03 DIAGNOSIS — S92425A Nondisplaced fracture of distal phalanx of left great toe, initial encounter for closed fracture: Secondary | ICD-10-CM | POA: Diagnosis not present

## 2021-08-03 DIAGNOSIS — F17220 Nicotine dependence, chewing tobacco, uncomplicated: Secondary | ICD-10-CM | POA: Diagnosis not present

## 2021-08-03 DIAGNOSIS — R109 Unspecified abdominal pain: Secondary | ICD-10-CM | POA: Diagnosis not present

## 2021-08-03 DIAGNOSIS — M79672 Pain in left foot: Secondary | ICD-10-CM | POA: Diagnosis not present

## 2021-08-03 DIAGNOSIS — S99922A Unspecified injury of left foot, initial encounter: Secondary | ICD-10-CM | POA: Diagnosis present

## 2021-08-03 LAB — COMPREHENSIVE METABOLIC PANEL
ALT: 33 U/L (ref 0–44)
AST: 42 U/L — ABNORMAL HIGH (ref 15–41)
Albumin: 4.4 g/dL (ref 3.5–5.0)
Alkaline Phosphatase: 107 U/L (ref 38–126)
Anion gap: 7 (ref 5–15)
BUN: 9 mg/dL (ref 6–20)
CO2: 29 mmol/L (ref 22–32)
Calcium: 9 mg/dL (ref 8.9–10.3)
Chloride: 98 mmol/L (ref 98–111)
Creatinine, Ser: 0.5 mg/dL — ABNORMAL LOW (ref 0.61–1.24)
GFR, Estimated: >60 mL/min (ref >60)
Glucose, Bld: 228 mg/dL — ABNORMAL HIGH (ref 70–99)
Potassium: 3.3 mmol/L — ABNORMAL LOW (ref 3.5–5.1)
Sodium: 134 mmol/L — ABNORMAL LOW (ref 135–145)
Total Bilirubin: 0.8 mg/dL (ref 0.3–1.2)
Total Protein: 7.7 g/dL (ref 6.5–8.1)

## 2021-08-03 LAB — CBC WITH DIFFERENTIAL/PLATELET
Abs Immature Granulocytes: 0.03 10*3/uL (ref 0.00–0.07)
Basophils Absolute: 0 10*3/uL (ref 0.0–0.1)
Basophils Relative: 0 %
Eosinophils Absolute: 0.2 10*3/uL (ref 0.0–0.5)
Eosinophils Relative: 2 %
HCT: 43 % (ref 39.0–52.0)
Hemoglobin: 14.9 g/dL (ref 13.0–17.0)
Immature Granulocytes: 0 %
Lymphocytes Relative: 17 %
Lymphs Abs: 2 10*3/uL (ref 0.7–4.0)
MCH: 29.3 pg (ref 26.0–34.0)
MCHC: 34.7 g/dL (ref 30.0–36.0)
MCV: 84.5 fL (ref 80.0–100.0)
Monocytes Absolute: 0.5 10*3/uL (ref 0.1–1.0)
Monocytes Relative: 5 %
Neutro Abs: 8.7 10*3/uL — ABNORMAL HIGH (ref 1.7–7.7)
Neutrophils Relative %: 76 %
Platelets: 219 10*3/uL (ref 150–400)
RBC: 5.09 MIL/uL (ref 4.22–5.81)
RDW: 12.8 % (ref 11.5–15.5)
WBC: 11.4 10*3/uL — ABNORMAL HIGH (ref 4.0–10.5)
nRBC: 0 % (ref 0.0–0.2)

## 2021-08-03 MED ORDER — METHOCARBAMOL 500 MG PO TABS: 500.0000 mg | ORAL_TABLET | Freq: Three times a day (TID) | ORAL | 0 refills | Status: DC | PRN

## 2021-08-03 MED ORDER — ONDANSETRON HCL 4 MG/2ML IJ SOLN
4.0000 mg | Freq: Once | INTRAMUSCULAR | Status: AC
  Administered 2021-08-03: 4 mg via INTRAVENOUS
  Filled 2021-08-03: qty 2

## 2021-08-03 MED ORDER — OXYCODONE-ACETAMINOPHEN 5-325 MG PO TABS: 1.0000 | ORAL_TABLET | Freq: Three times a day (TID) | ORAL | 0 refills | Status: AC | PRN

## 2021-08-03 MED ORDER — METHOCARBAMOL 500 MG PO TABS: 500.0000 mg | ORAL_TABLET | Freq: Three times a day (TID) | ORAL | 0 refills | Status: AC | PRN

## 2021-08-03 MED ORDER — IOHEXOL 350 MG/ML SOLN
80.0000 mL | Freq: Once | INTRAVENOUS | Status: AC | PRN
  Administered 2021-08-03: 80 mL via INTRAVENOUS

## 2021-08-03 MED ORDER — FENTANYL CITRATE PF 50 MCG/ML IJ SOSY
50.0000 ug | PREFILLED_SYRINGE | Freq: Once | INTRAMUSCULAR | Status: AC
  Administered 2021-08-03: 50 ug via INTRAVENOUS
  Filled 2021-08-03: qty 1
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
## 2021-08-03 NOTE — Progress Notes (Signed)
Orthopedic Tech Progress Note Patient Details:  Jesse Griffin November 30, 1990 [id number]  Patient ID: Jesse Griffin, male   DOB: 1991-04-21, 30 y.o.   MRN: [id number]  Jesse Griffin 08/03/2021, 5:16 PM Applied cam walker, adjusted crutches for height and gave to patient.
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
## 2023-04-04 IMAGING — CT CT FOOT*L* W/O CM
3 series · 14 of 27 positions shown, 17 images · non-contrast
Comparison: Same day radiograph

CLINICAL DATA: Foot trauma, Lisfranc suspected, xray done (Age >=
6y)

EXAM:
CT OF THE LEFT FOOT WITHOUT CONTRAST
TECHNIQUE: Multidetector CT imaging of the left foot was performed according to
the standard protocol. Multiplanar CT image reconstructions were
also generated.

[Series 4: axial st · axial · 0.39mm/px · z∈[-1750,-1590]mm · 5 of 120 slices shown, 7 images]
[im 20/120  soft-tissue]
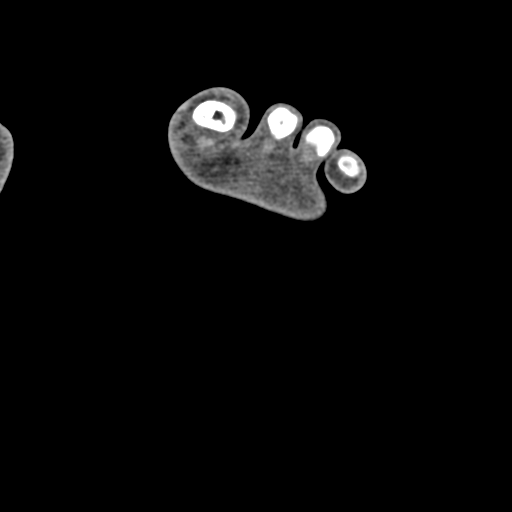
[im 20/120  bone]
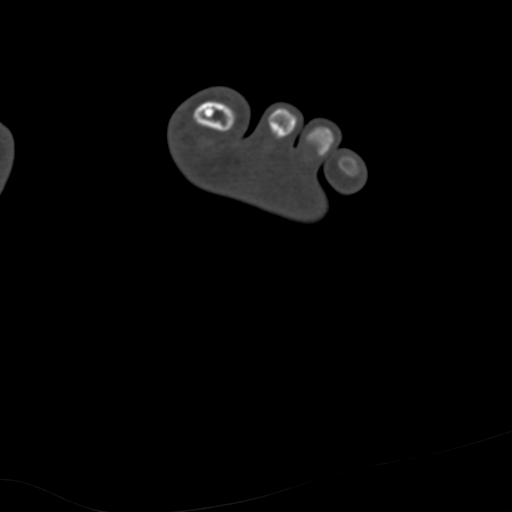
[im 40/120  bone]
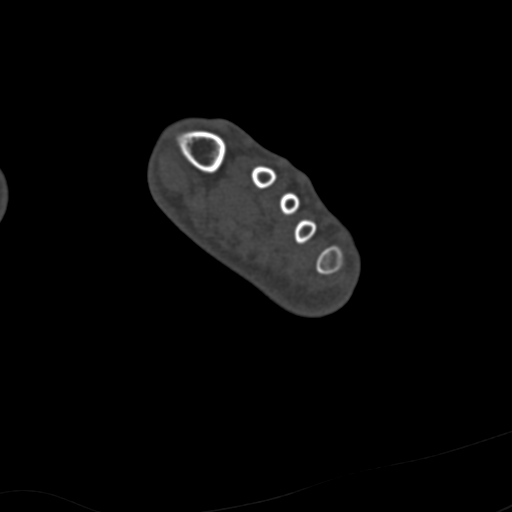
[im 60/120  bone]
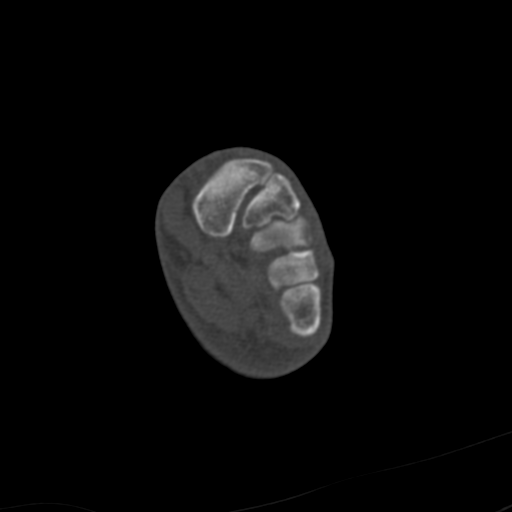
[im 80/120  bone]
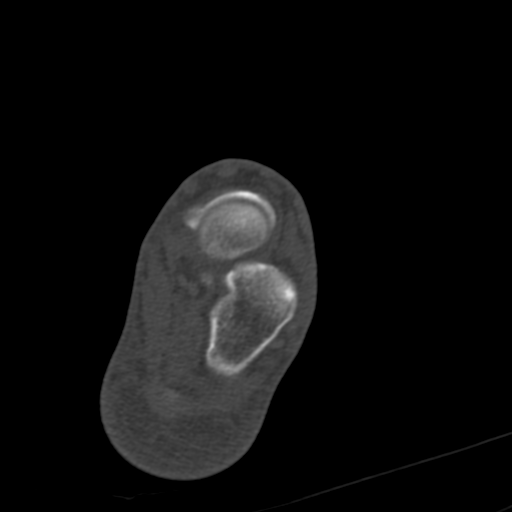
[im 100/120  soft-tissue]
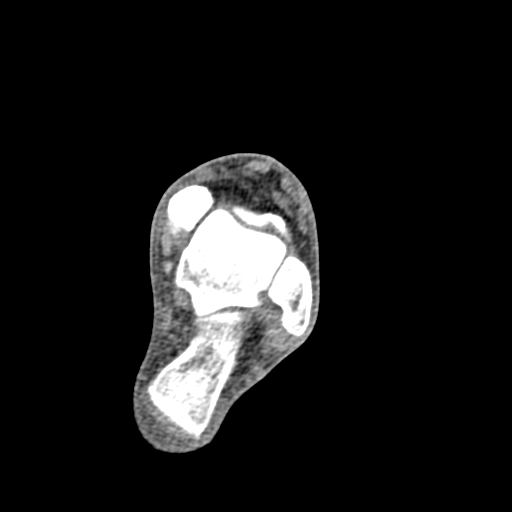
[im 100/120  bone]
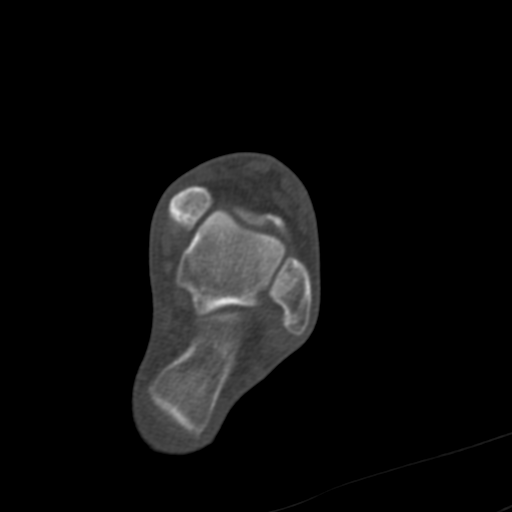

[Series 10: sagittal st · sagittal · 0.48mm/px · 5 of 50 slices shown, 6 images]
[im 17/50  bone]
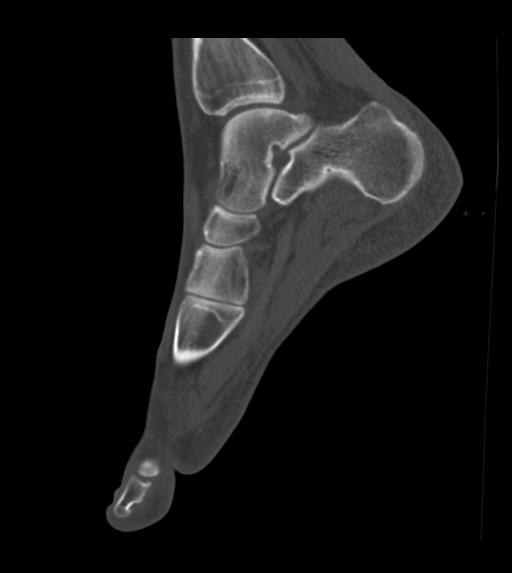
[im 21/50  bone]
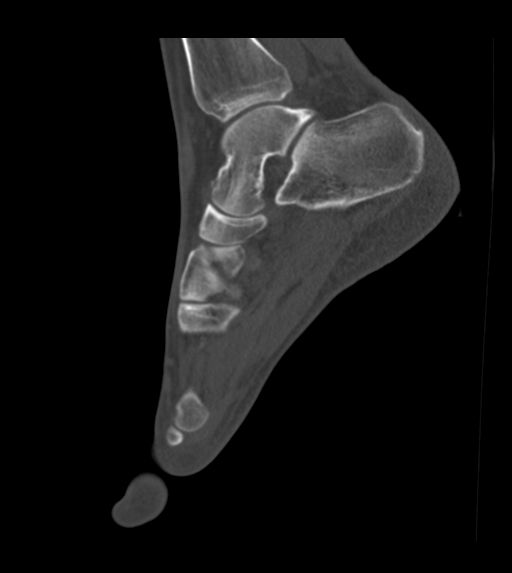
[im 25/50  soft-tissue]
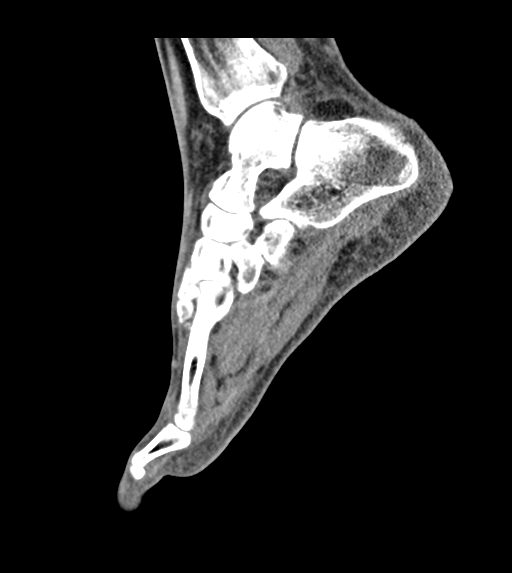
[im 25/50  bone]
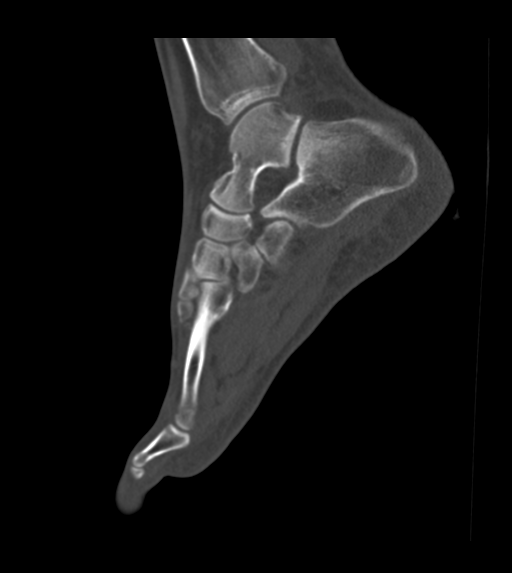
[im 29/50  bone]
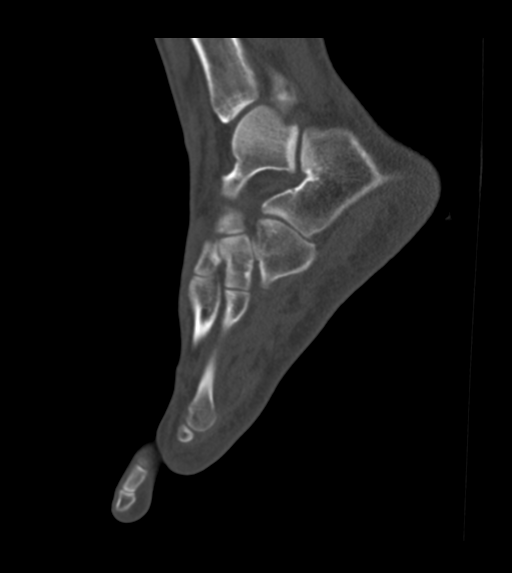
[im 33/50  bone]
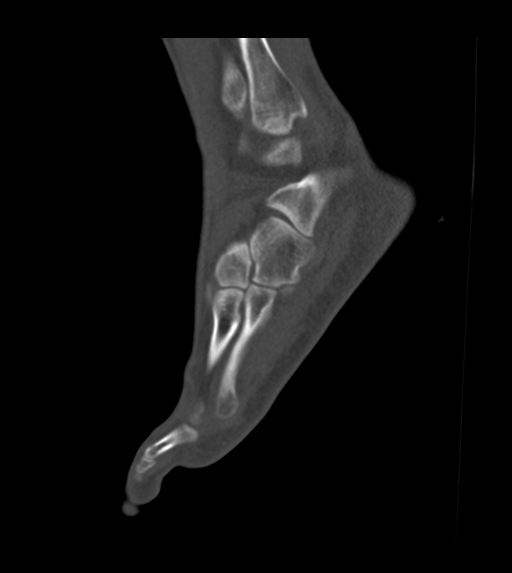

[Series 11: axial bone · axial · 0.33mm/px · z∈[-1714,-1606]mm · 4 of 132 slices shown]
[im 22/132  bone]
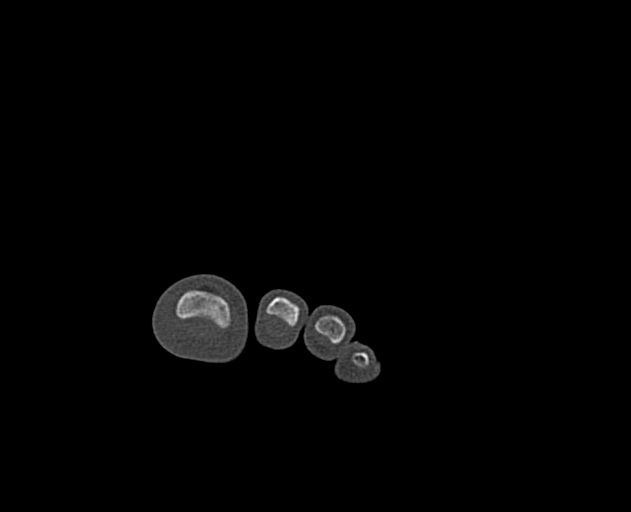
[im 44/132  bone]
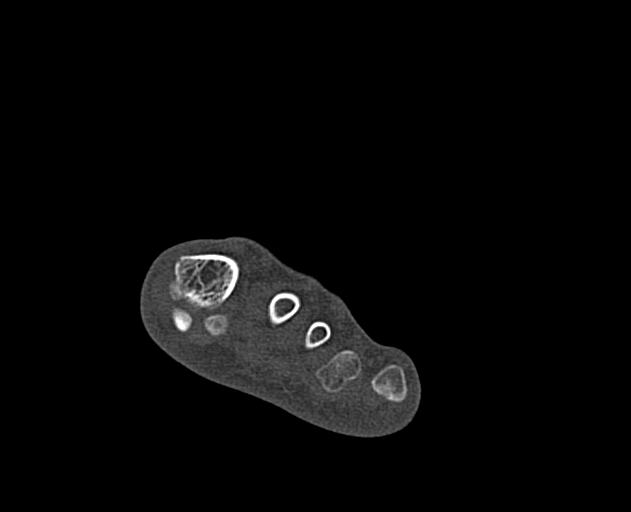
[im 66/132  bone]
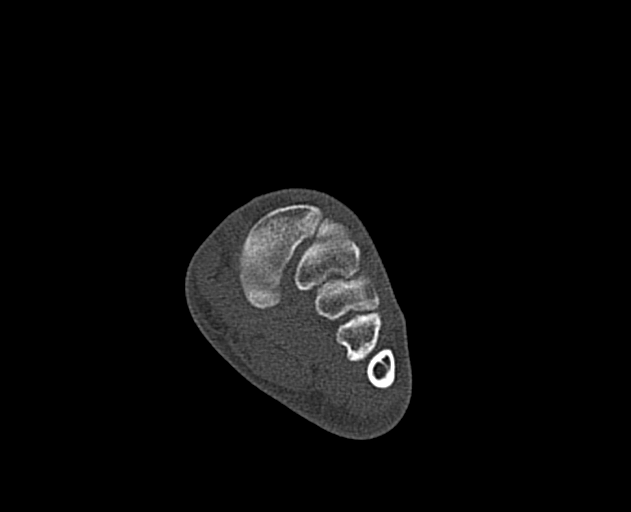
[im 88/132  bone]
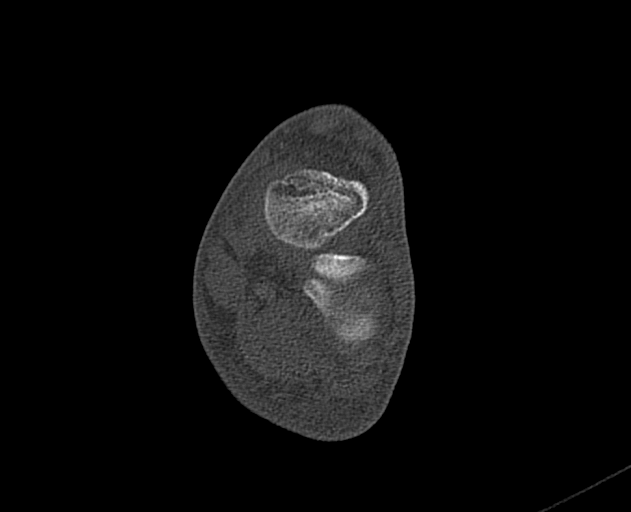

[14 of 27 positions shown; findings below may reference images not displayed]

FINDINGS: Bones/Joint/Cartilage

There is a nondisplaced fracture at the lateral aspect of the base
of the great toe distal phalanx extending into the interphalangeal
joint (series 7, image 42).

There is an accessory os intermetatarseum between the bases of the
first and second metatarsals.

There is a nondisplaced tiny fracture at the plantar lateral aspect
of the base of the second metatarsal.

Normal tarsometatarsal joint alignment.

Ligaments

Suboptimally assessed by CT.

Muscles and Tendons

No significant muscle atrophy. No acute tendon abnormality on
noncontrast CT.

Soft tissues

Great toe soft tissue swelling
IMPRESSION: Nondisplaced fracture at the lateral aspect of the base of the great
toe distal phalanx extending into the interphalangeal joint.

Tiny nondisplaced fracture at the plantar lateral aspect of the base
of the second metatarsal. Normal midfoot alignment.

The well corticated bony density noted on the lateral view of the
foot radiograph corresponds to an accessory os intermetatarseum.

## 2023-04-04 IMAGING — CT CT MAXILLOFACIAL W/O CM
3 series · 15 of 47 positions shown, 18 images · non-contrast
Comparison: Head CT 11/21/2014

CLINICAL DATA: Head trauma, mod-severe; Facial trauma; Neck trauma,
dangerous injury mechanism (Age 16-64y)

EXAM:
CT HEAD WITHOUT CONTRAST
CT MAXILLOFACIAL WITHOUT CONTRAST
CT CERVICAL SPINE WITHOUT CONTRAST
TECHNIQUE: Multidetector CT imaging of the head, cervical spine, and
maxillofacial structures were performed using the standard protocol
without intravenous contrast. Multiplanar CT image reconstructions
of the cervical spine and maxillofacial structures were also
generated.

[Series 1: max soft · axial · 0.33mm/px · z∈[-238,-88]mm · 9 of 89 slices shown, 12 images]
[im 7/89  brain]
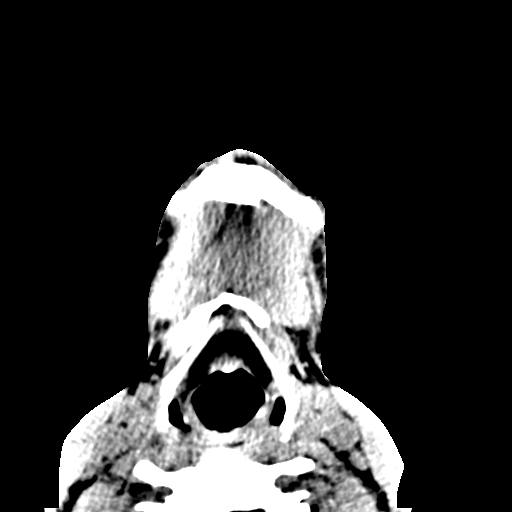
[im 7/89  bone]
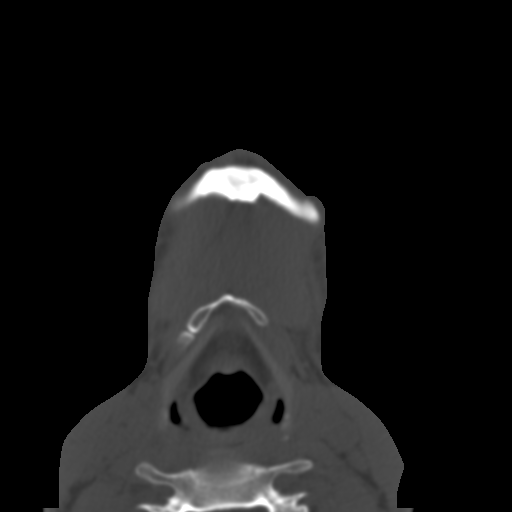
[im 16/89  bone]
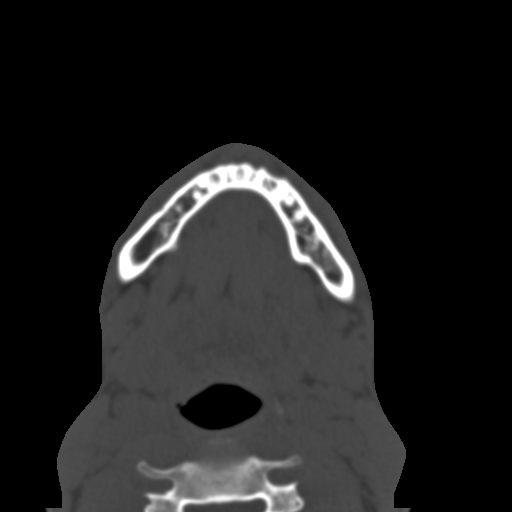
[im 25/89  bone]
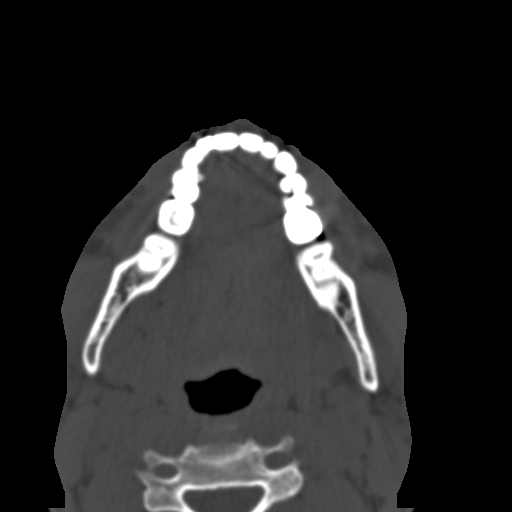
[im 34/89  bone]
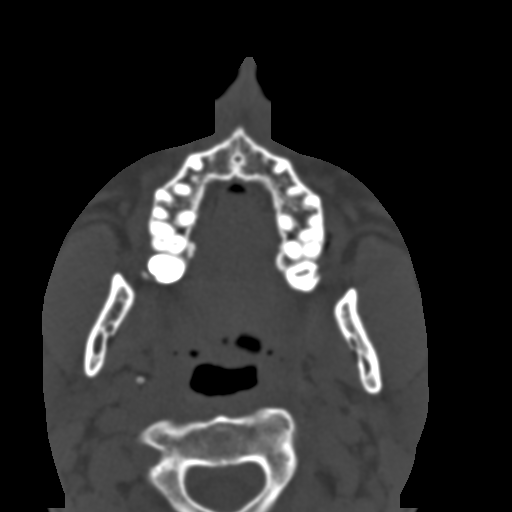
[im 46/89  brain]
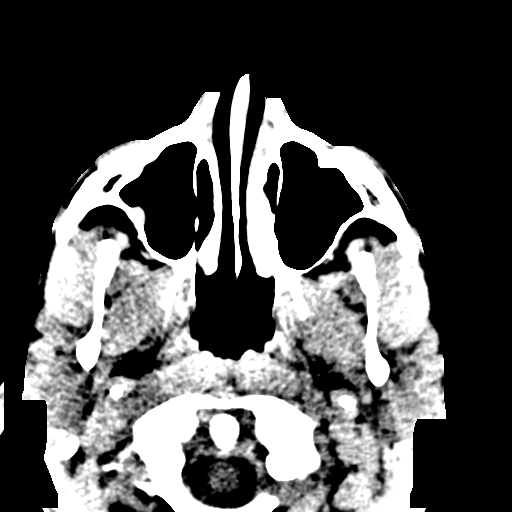
[im 46/89  bone]
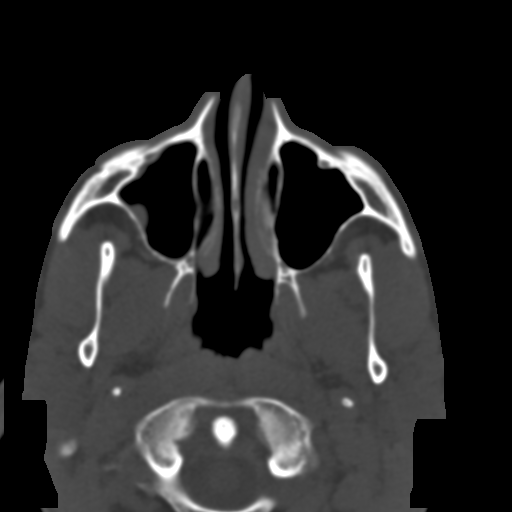
[im 55/89  bone]
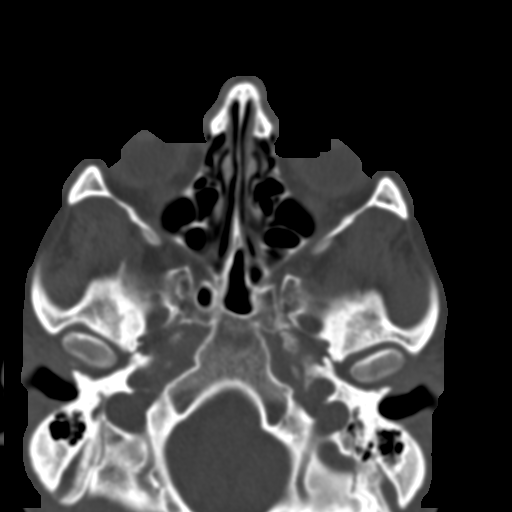
[im 64/89  bone]
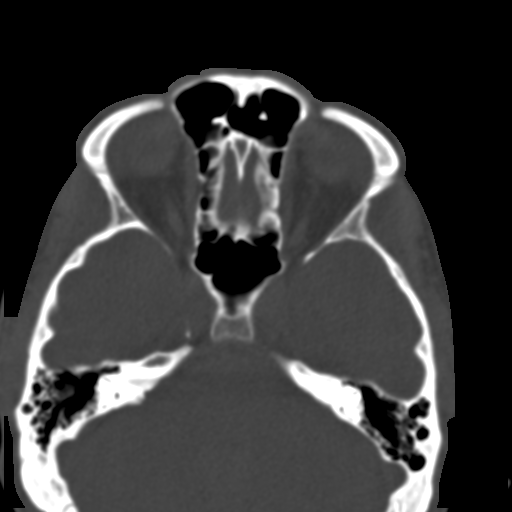
[im 73/89  bone]
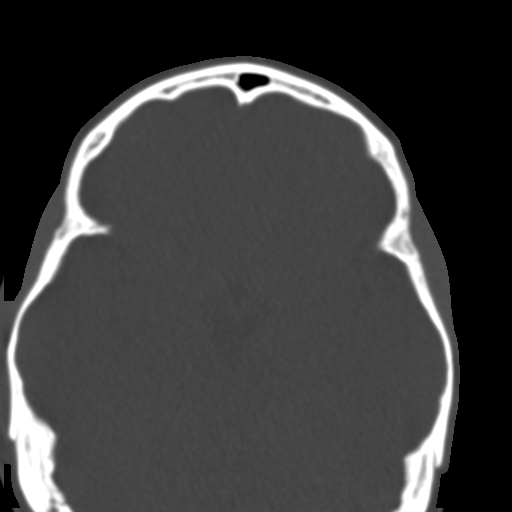
[im 82/89  brain]
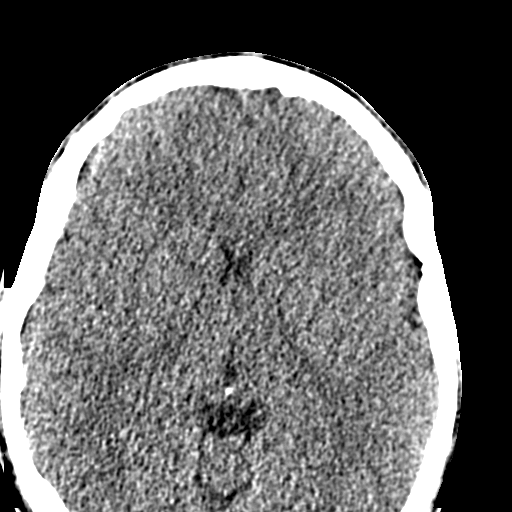
[im 82/89  bone]
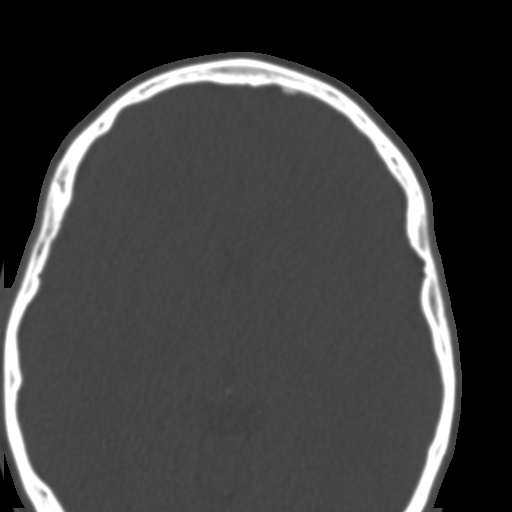

[Series 7: coronal soft · coronal · 0.42mm/px · 3 of 73 slices shown]
[im 25/73  bone]
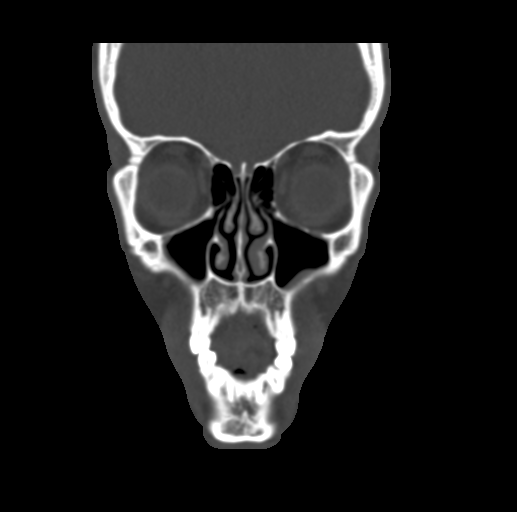
[im 33/73  bone]
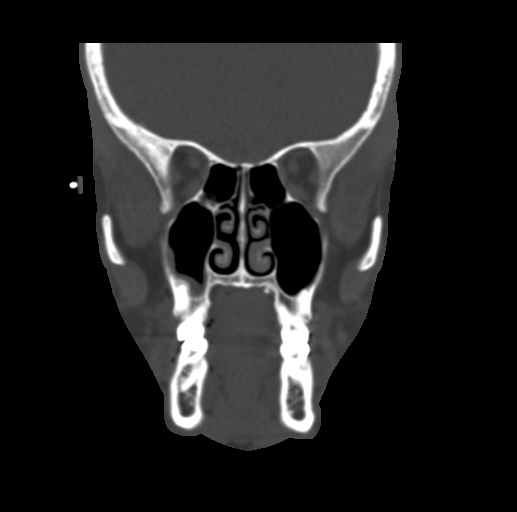
[im 41/73  bone]
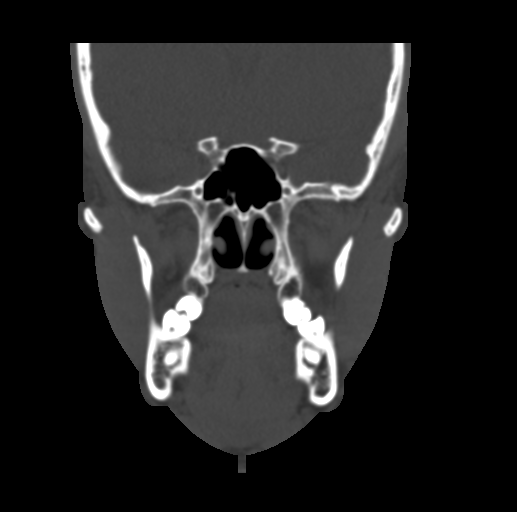

[Series 8: sagittal soft · sagittal · 0.33mm/px · 3 of 77 slices shown]
[im 26/77  bone]
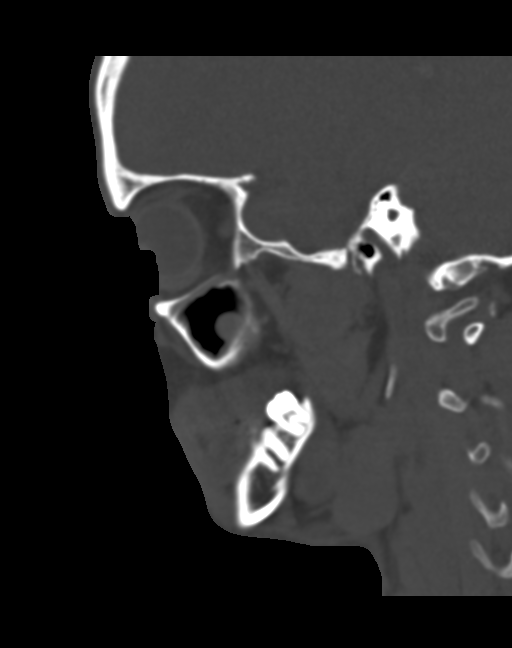
[im 39/77  bone]
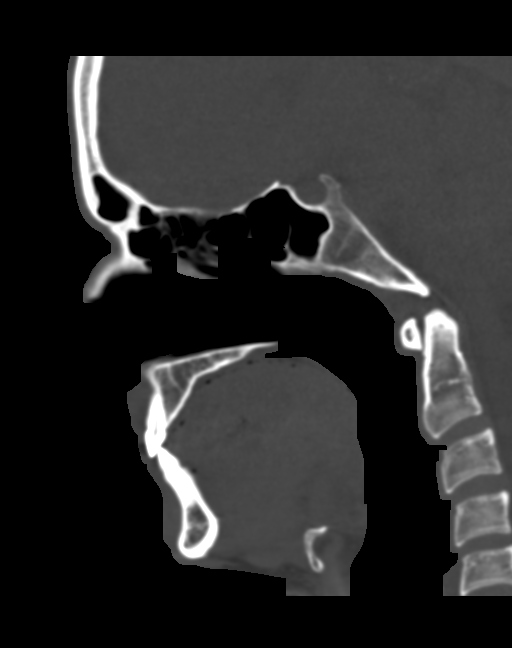
[im 51/77  bone]
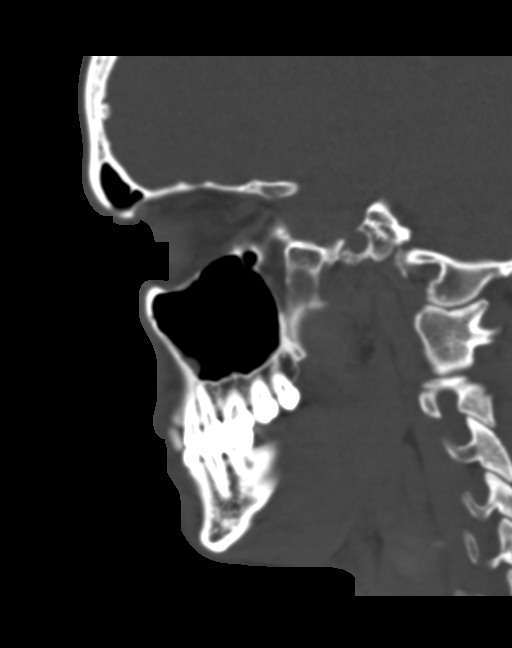

[15 of 47 positions shown; findings below may reference images not displayed]

FINDINGS: CT HEAD FINDINGS

Brain: No evidence of acute intracranial hemorrhage or extra-axial
collection.No evidence of mass lesion/concern mass effect.The
ventricles are normal in size.

Vascular: No hyperdense vessel or unexpected calcification.

Skull: Normal. Negative for fracture or focal lesion.

Other: None.

CT MAXILLOFACIAL FINDINGS

Osseous: No fracture or mandibular dislocation. No destructive
process.

Orbits: Negative. No traumatic or inflammatory finding.

Sinuses: Clear.

Soft tissues: Negative.

CT CERVICAL SPINE FINDINGS

Alignment: Normal.

Skull base and vertebrae: No acute fracture. No primary bone lesion
or focal pathologic process.

Soft tissues and spinal canal: No prevertebral fluid or swelling. No
visible canal hematoma.

Disc levels:  Preserved disc heights.

Upper chest: Negative.

Other: None
IMPRESSION: No acute intracranial abnormality.  No acute facial fracture.

No acute cervical spine fracture.

## 2023-04-04 IMAGING — CT CT CERVICAL SPINE W/O CM
3 of 4 series · 13 of 33 positions shown, 16 images · non-contrast
Comparison: Head CT 11/21/2014

CLINICAL DATA: Head trauma, mod-severe; Facial trauma; Neck trauma,
dangerous injury mechanism (Age 16-64y)

EXAM:
CT HEAD WITHOUT CONTRAST
CT MAXILLOFACIAL WITHOUT CONTRAST
CT CERVICAL SPINE WITHOUT CONTRAST
TECHNIQUE: Multidetector CT imaging of the head, cervical spine, and
maxillofacial structures were performed using the standard protocol
without intravenous contrast. Multiplanar CT image reconstructions
of the cervical spine and maxillofacial structures were also
generated.

[Series 6: orthogonal axials · axial · 0.23mm/px · z∈[-310,-170]mm · 5 of 107 slices shown, 7 images]
[im 18/107  soft-tissue]
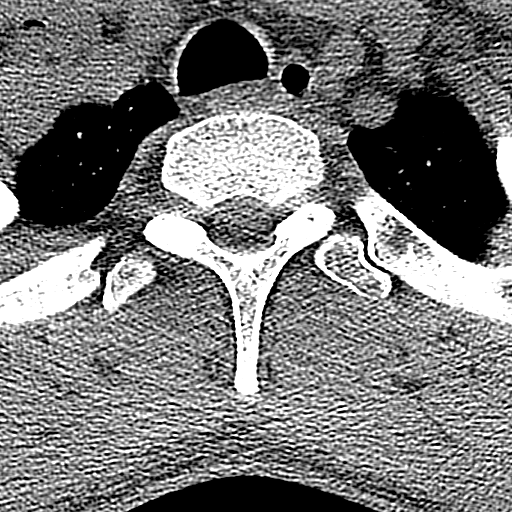
[im 18/107  bone]
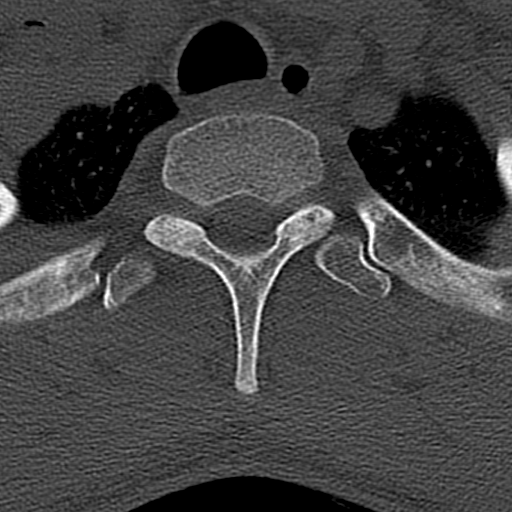
[im 36/107  bone]
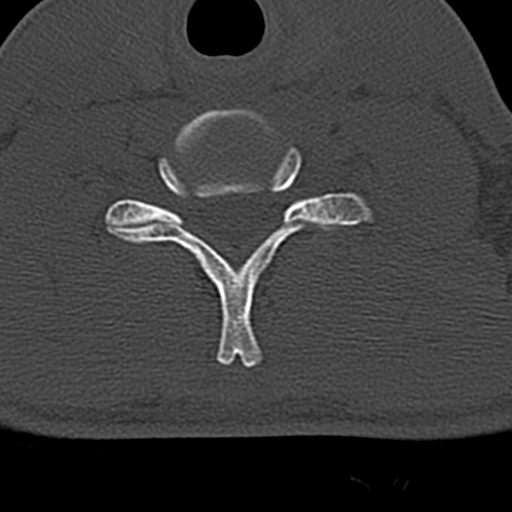
[im 54/107  bone]
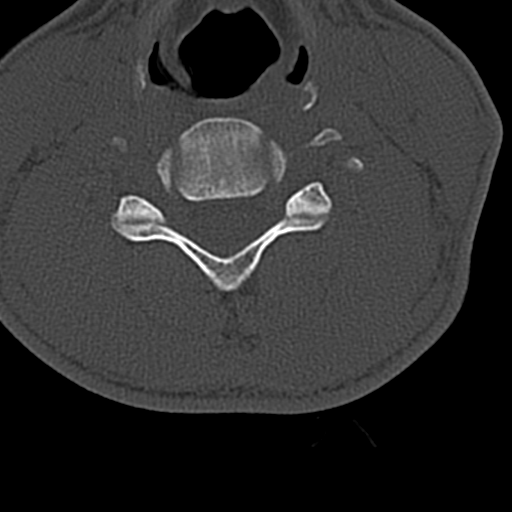
[im 71/107  bone]
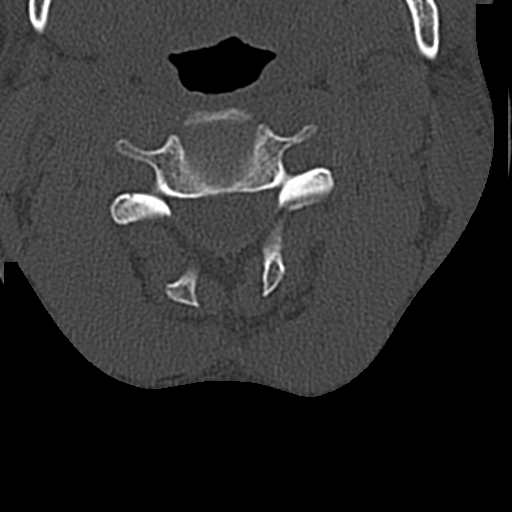
[im 89/107  soft-tissue]
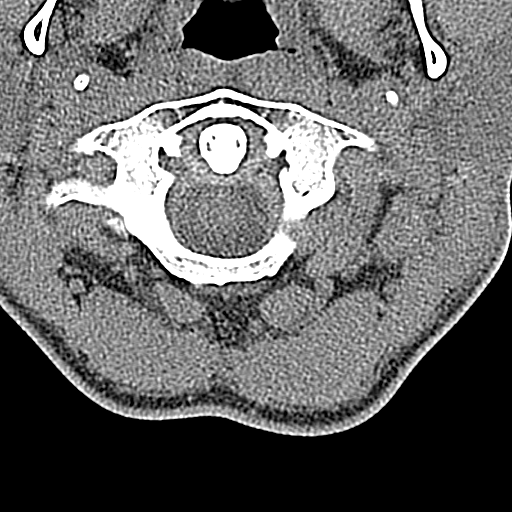
[im 89/107  bone]
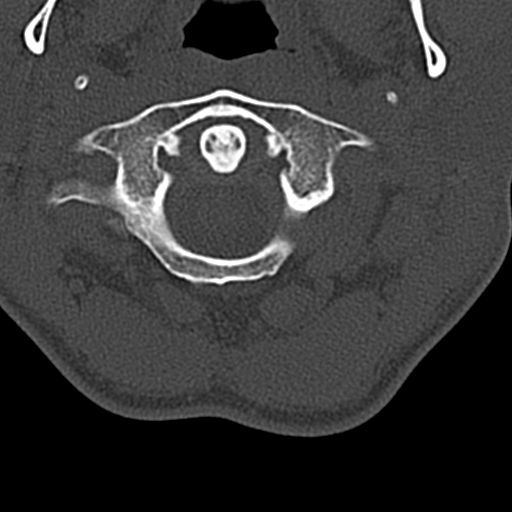

[Series 7: coronal bone · coronal · 0.36mm/px · 3 of 67 slices shown]
[im 14/67  bone]
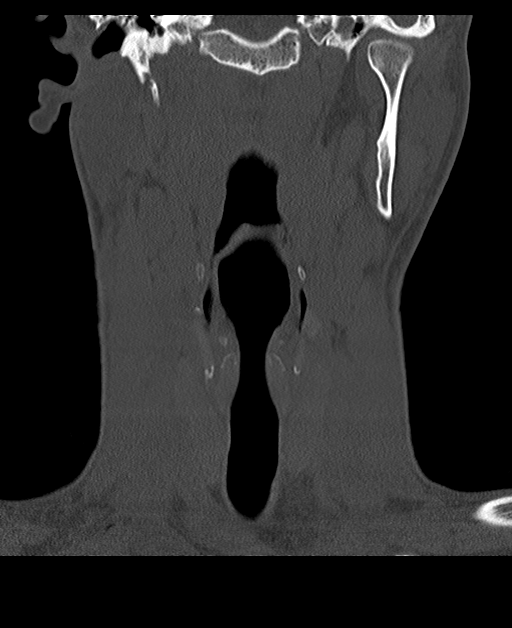
[im 27/67  bone]
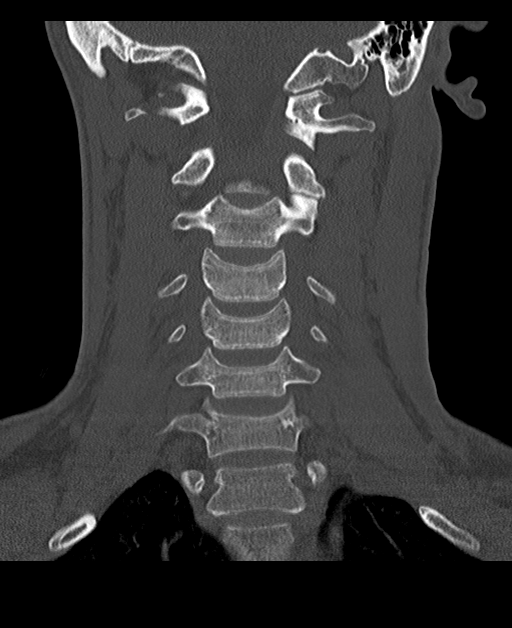
[im 40/67  bone]
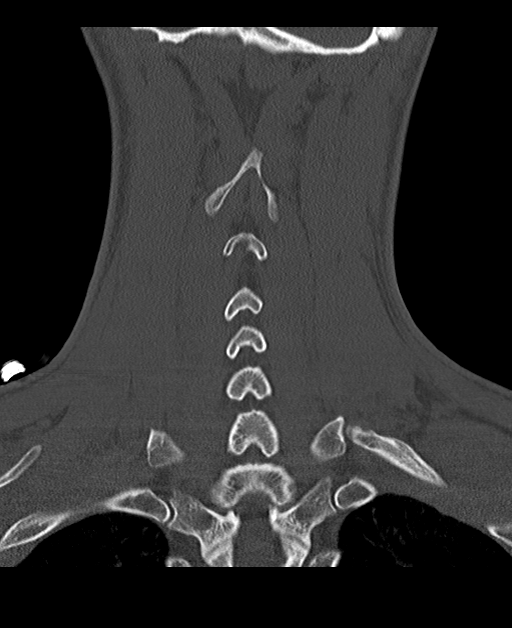

[Series 8: sagittal bone · sagittal · 0.32mm/px · 5 of 53 slices shown, 6 images]
[im 18/53  bone]
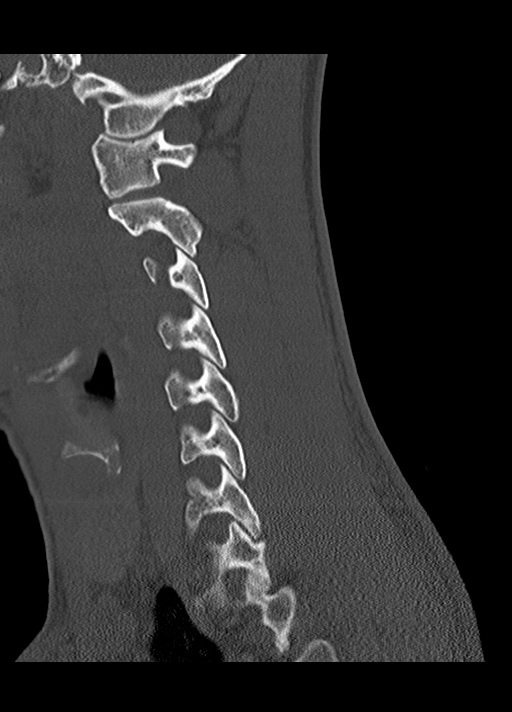
[im 22/53  bone]
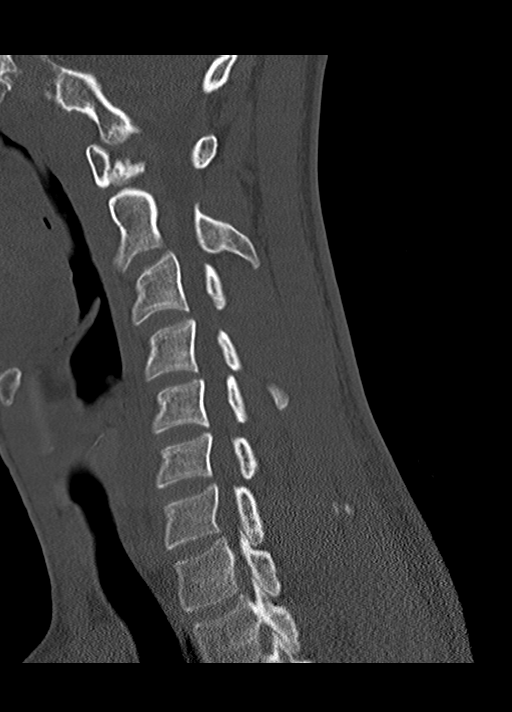
[im 27/53  soft-tissue]
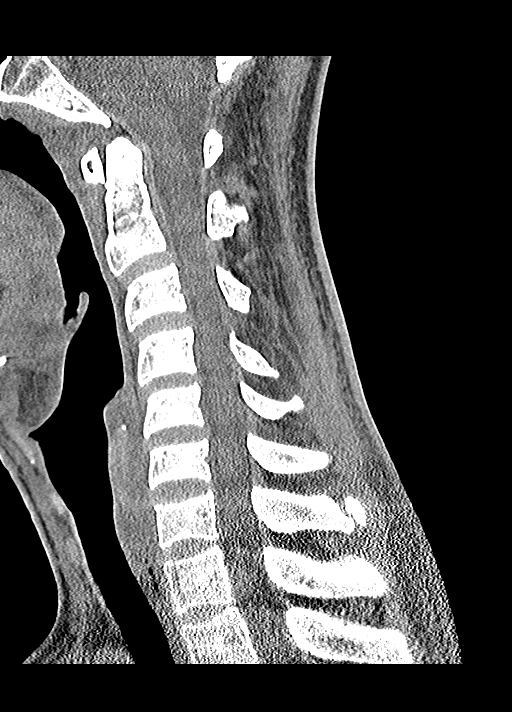
[im 27/53  bone]
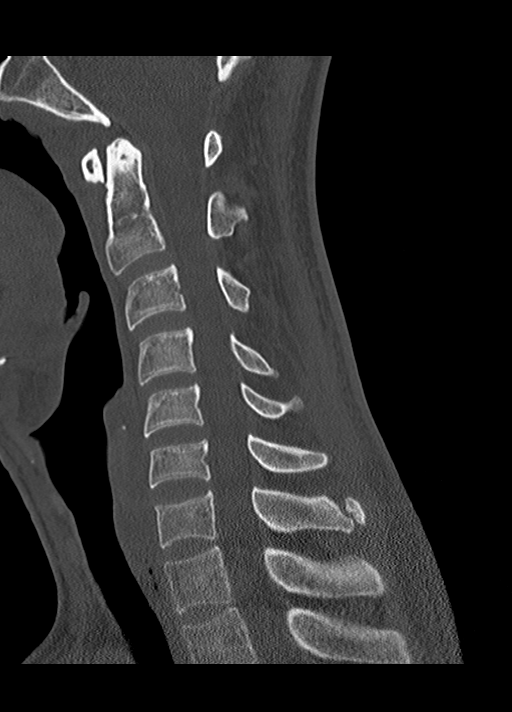
[im 31/53  bone]
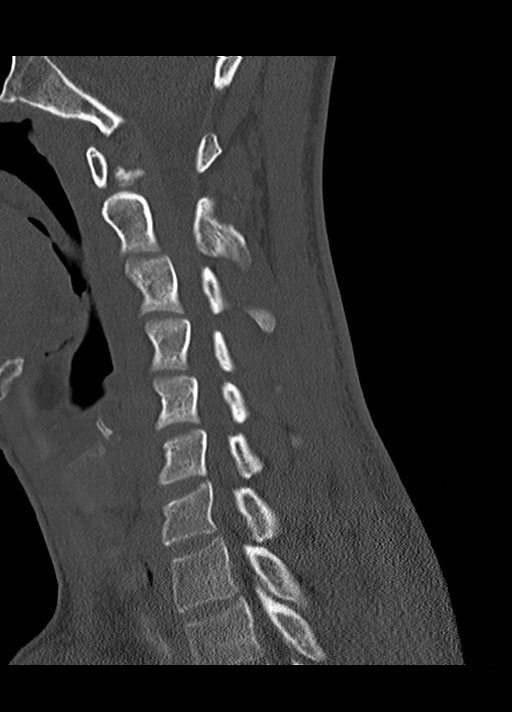
[im 35/53  bone]
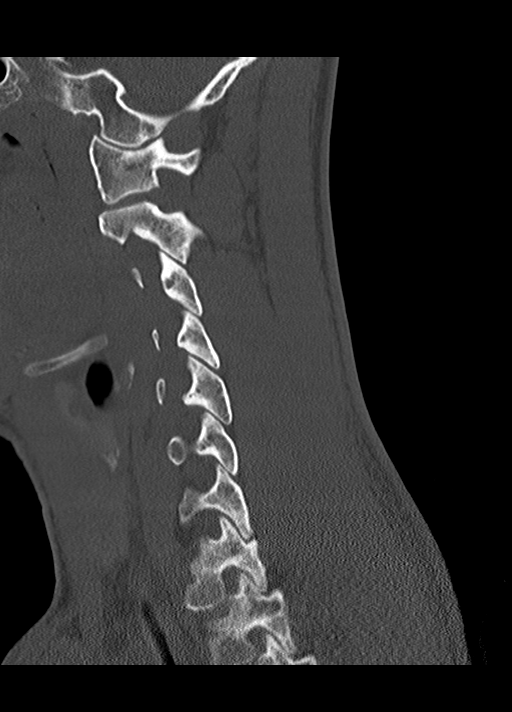

[13 of 33 positions shown; findings below may reference images not displayed]

FINDINGS: CT HEAD FINDINGS

Brain: No evidence of acute intracranial hemorrhage or extra-axial
collection.No evidence of mass lesion/concern mass effect.The
ventricles are normal in size.

Vascular: No hyperdense vessel or unexpected calcification.

Skull: Normal. Negative for fracture or focal lesion.

Other: None.

CT MAXILLOFACIAL FINDINGS

Osseous: No fracture or mandibular dislocation. No destructive
process.

Orbits: Negative. No traumatic or inflammatory finding.

Sinuses: Clear.

Soft tissues: Negative.

CT CERVICAL SPINE FINDINGS

Alignment: Normal.

Skull base and vertebrae: No acute fracture. No primary bone lesion
or focal pathologic process.

Soft tissues and spinal canal: No prevertebral fluid or swelling. No
visible canal hematoma.

Disc levels:  Preserved disc heights.

Upper chest: Negative.

Other: None
IMPRESSION: No acute intracranial abnormality.  No acute facial fracture.

No acute cervical spine fracture.

## 2023-05-04 ENCOUNTER — Emergency Department (HOSPITAL_COMMUNITY)
Admission: EM | Admit: 2023-05-04 | Discharge: 2023-05-05 | Discharge status: Against Medical Advice | Payer: 59 | Attending: Emergency Medicine | Admitting: Emergency Medicine

## 2023-05-04 ENCOUNTER — Encounter (HOSPITAL_COMMUNITY): Payer: Self-pay

## 2023-05-04 ENCOUNTER — Other Ambulatory Visit: Payer: Self-pay

## 2023-05-04 DIAGNOSIS — Z5321 Procedure and treatment not carried out due to patient leaving prior to being seen by health care provider: Secondary | ICD-10-CM | POA: Diagnosis not present

## 2023-05-04 DIAGNOSIS — X58XXXA Exposure to other specified factors, initial encounter: Secondary | ICD-10-CM | POA: Diagnosis not present

## 2023-05-04 DIAGNOSIS — S61216A Laceration without foreign body of right little finger without damage to nail, initial encounter: Secondary | ICD-10-CM | POA: Diagnosis not present

## 2023-05-04 NOTE — ED Triage Notes (Signed)
Arrived POV. C/o right pinky laceration from a tractor. Had tetanus shot within the last year. Bleeding controlled in triage.
